# Patient Record
Sex: Female | Born: 1990 | Race: White | Hispanic: No | Marital: Married | Smoking: Never smoker
Health system: Southern US, Community
[De-identification: ages and names within clinical notes are randomized; demographics above are authoritative.]

## PROBLEM LIST (undated history)

## (undated) ENCOUNTER — Inpatient Hospital Stay (HOSPITAL_COMMUNITY): Payer: Self-pay

## (undated) DIAGNOSIS — M722 Plantar fascial fibromatosis: Secondary | ICD-10-CM

## (undated) DIAGNOSIS — E282 Polycystic ovarian syndrome: Secondary | ICD-10-CM

## (undated) DIAGNOSIS — F329 Major depressive disorder, single episode, unspecified: Secondary | ICD-10-CM

## (undated) DIAGNOSIS — D649 Anemia, unspecified: Secondary | ICD-10-CM

## (undated) DIAGNOSIS — R55 Syncope and collapse: Secondary | ICD-10-CM

## (undated) DIAGNOSIS — F32A Depression, unspecified: Secondary | ICD-10-CM

## (undated) HISTORY — DX: Major depressive disorder, single episode, unspecified: F32.9

## (undated) HISTORY — DX: Syncope and collapse: R55

## (undated) HISTORY — PX: WISDOM TOOTH EXTRACTION: SHX21

## (undated) HISTORY — DX: Plantar fascial fibromatosis: M72.2

## (undated) HISTORY — DX: Polycystic ovarian syndrome: E28.2

## (undated) HISTORY — DX: Depression, unspecified: F32.A

---

## 2009-08-31 ENCOUNTER — Emergency Department (HOSPITAL_COMMUNITY): Admission: EM | Admit: 2009-08-31 | Discharge: 2009-08-31 | Payer: Self-pay | Admitting: Emergency Medicine

## 2010-11-19 IMAGING — CR DG KNEE COMPLETE 4+V*R*
5 series · 5 of 5 positions shown · non-contrast
Comparison: None.

CLINICAL DATA: History of injury from fall with pain

RIGHT KNEE - COMPLETE 4+ VIEW

[t knee ap right]
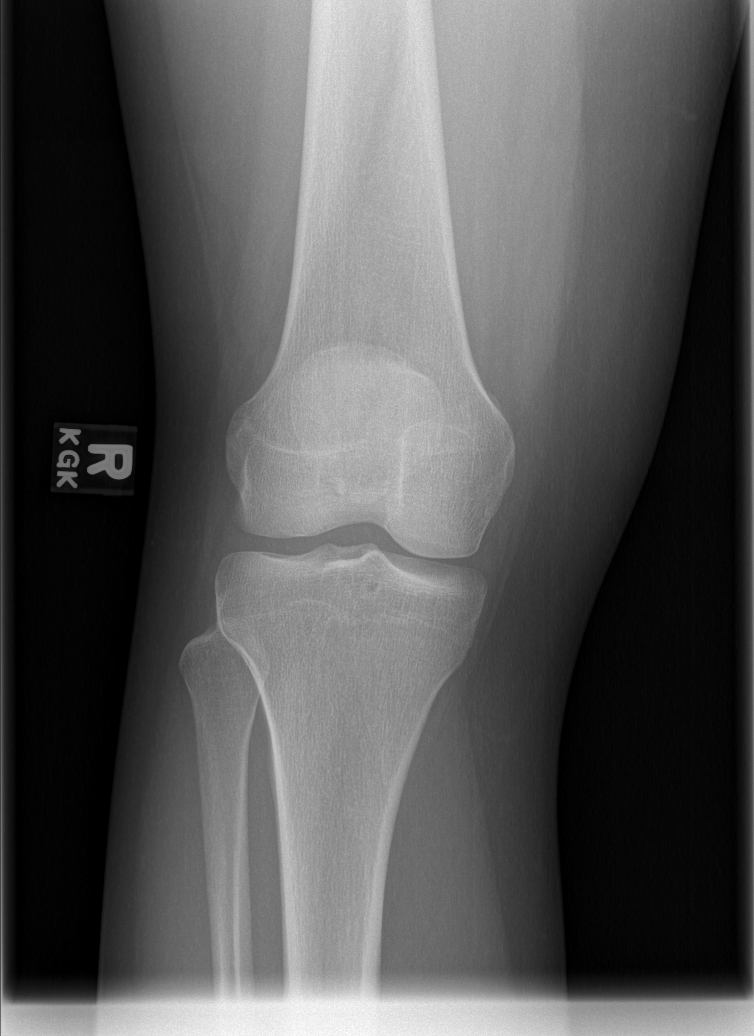

[t knee oblique right (1 of 2)]
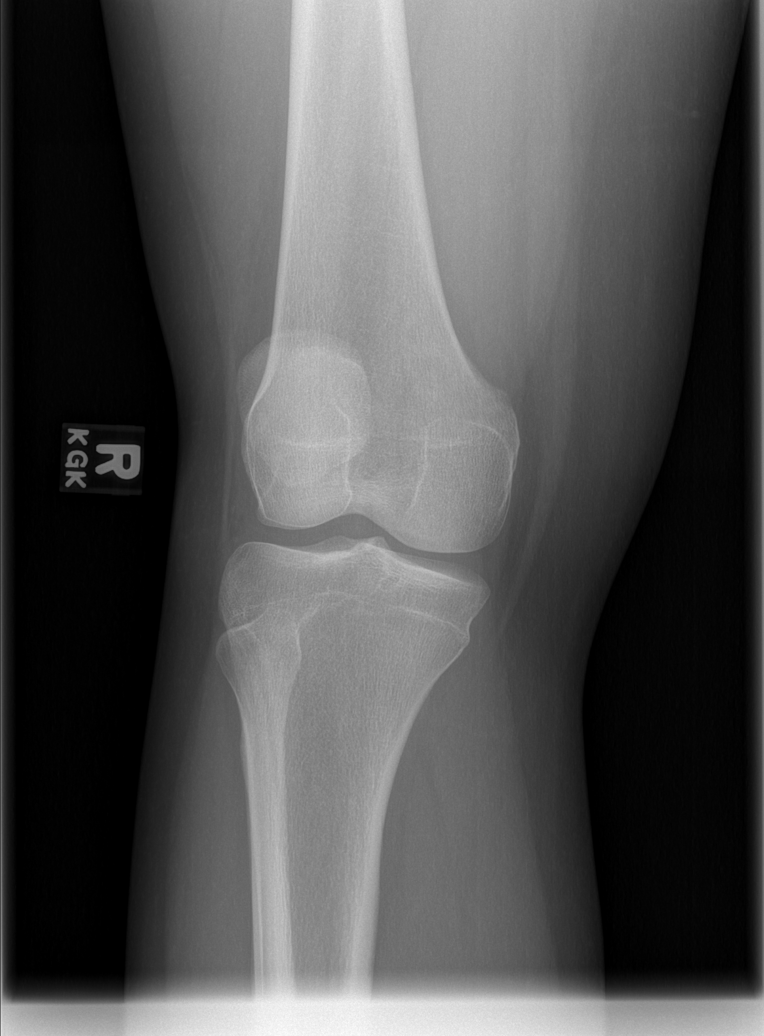

[t knee oblique right (2 of 2)]
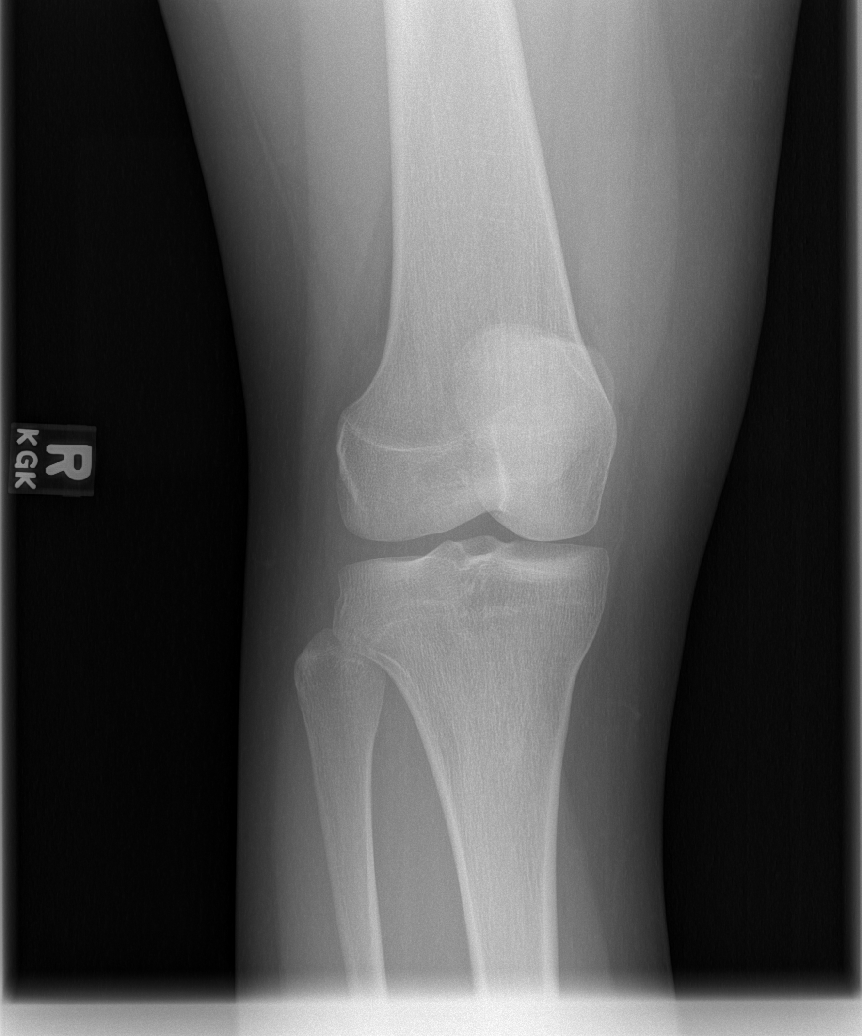

[t knee lat right (1 of 2)]
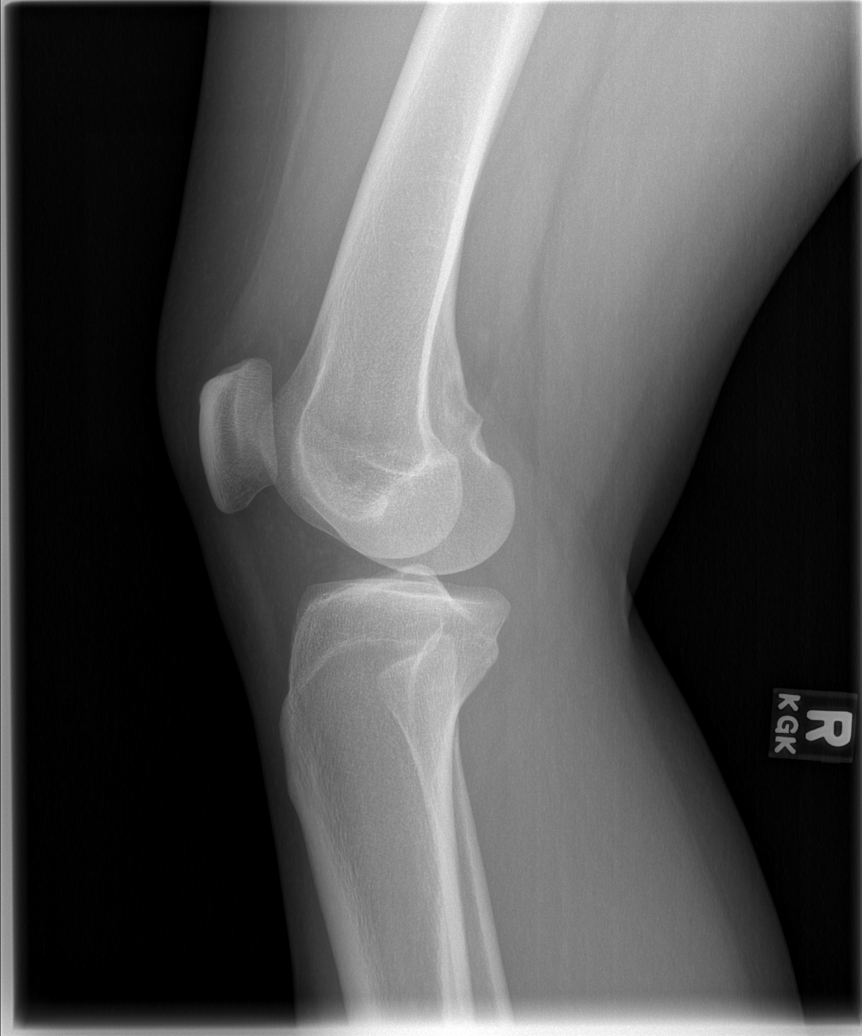

[t knee lat right (2 of 2)]
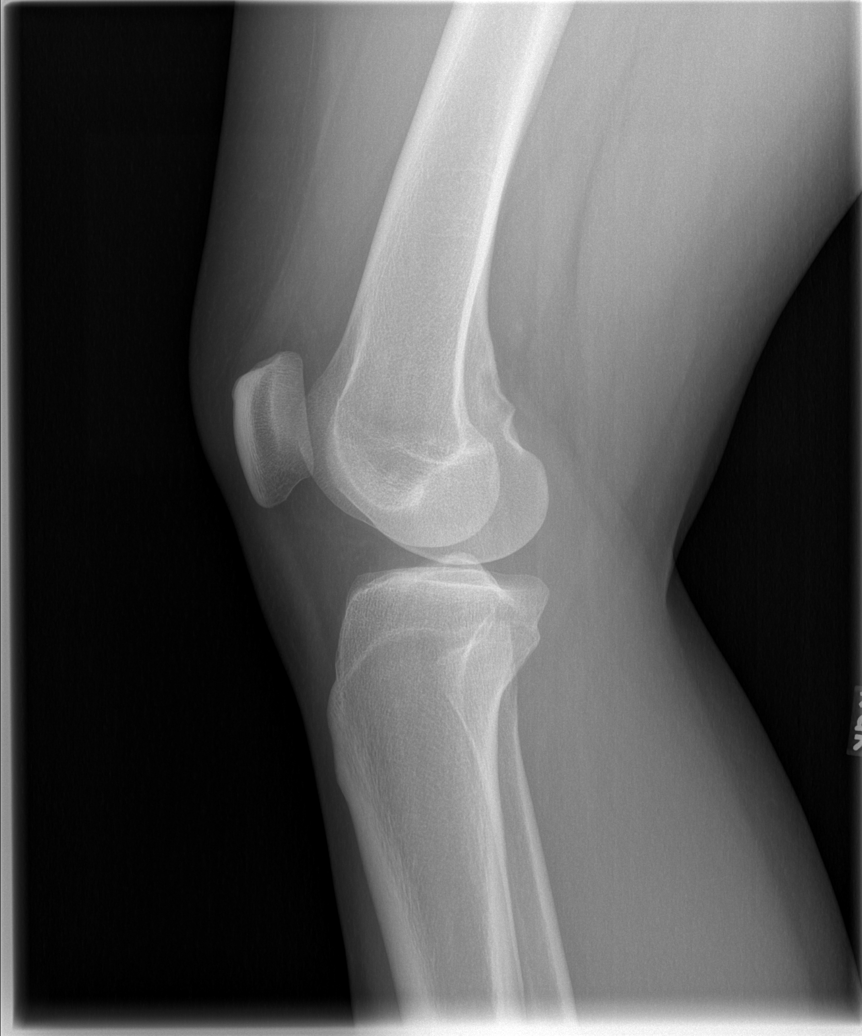

[5 of 5 positions shown; findings below may reference images not displayed]

FINDINGS: There is minimal density in the suprapatellar region on
lateral view which may reflect a small amount of joint effusion.
Alignment is normal.  Joint spaces are preserved.  No fracture or
dislocation is evident.  No soft tissue lesions are seen.
IMPRESSION: Minimal density in suprapatellar region may reflect small amount of
joint effusion but is not definite.  No fracture or dislocation is
evident.

## 2011-03-06 LAB — OB RESULTS CONSOLE GC/CHLAMYDIA: Chlamydia: NEGATIVE

## 2011-07-18 NOTE — L&D Delivery Note (Signed)
Delivery Note At 3:05 PM a viable and healthy female was delivered via Vaginal, Spontaneous Delivery (Presentation: ; Occiput Anterior).  APGAR: 8, 9; weight 9 lb 2.2 oz (4145 g).  Shoulder delivered without complications. Left ML episiotomy placed since head crowing against tight perineum was not allowing delivery. Placenta status: Intact, Spontaneous.  Cord: 3 vessels with the following complications: None.  Cord pH:not indicated Anesthesia: Epidural  Episiotomy: Left Mediolateral, no extensions. Lacerations:none other  Suture Repair: 3.0 vicryl rapide Est. Blood Loss (mL): 400  Mom to postpartum.  Baby to with mother.  Marrell Dicaprio R 02/20/2012, 5:04 PM

## 2011-08-03 LAB — OB RESULTS CONSOLE RPR: RPR: NONREACTIVE

## 2012-01-16 LAB — OB RESULTS CONSOLE GBS: GBS: NEGATIVE

## 2012-02-04 ENCOUNTER — Inpatient Hospital Stay (HOSPITAL_COMMUNITY)
Admission: AD | Admit: 2012-02-04 | Discharge: 2012-02-04 | Disposition: A | Discharge status: Home / Self Care | Payer: Managed Care, Other (non HMO) | Source: Ambulatory Visit | Attending: Obstetrics and Gynecology | Admitting: Obstetrics and Gynecology

## 2012-02-04 ENCOUNTER — Encounter (HOSPITAL_COMMUNITY): Payer: Self-pay | Admitting: *Deleted

## 2012-02-04 DIAGNOSIS — O99891 Other specified diseases and conditions complicating pregnancy: Secondary | ICD-10-CM | POA: Insufficient documentation

## 2012-02-04 DIAGNOSIS — R51 Headache: Secondary | ICD-10-CM | POA: Insufficient documentation

## 2012-02-04 HISTORY — DX: Anemia, unspecified: D64.9

## 2012-02-04 LAB — URINE MICROSCOPIC-ADD ON

## 2012-02-04 LAB — COMPREHENSIVE METABOLIC PANEL
Alkaline Phosphatase: 141 U/L — ABNORMAL HIGH (ref 39–117)
CO2: 21 mEq/L (ref 19–32)
Calcium: 9 mg/dL (ref 8.4–10.5)
GFR calc Af Amer: >90 mL/min (ref >90)
GFR calc non Af Amer: >90 mL/min (ref >90)
Glucose, Bld: 93 mg/dL (ref 70–99)
Potassium: 3.7 mEq/L (ref 3.5–5.1)
Sodium: 135 mEq/L (ref 135–145)
Total Bilirubin: 0.2 mg/dL — ABNORMAL LOW (ref 0.3–1.2)

## 2012-02-04 LAB — URINALYSIS, ROUTINE W REFLEX MICROSCOPIC
Ketones, ur: NEGATIVE mg/dL
Protein, ur: NEGATIVE mg/dL
Specific Gravity, Urine: 1.01 (ref 1.005–1.030)
Urobilinogen, UA: 0.2 mg/dL (ref 0.0–1.0)

## 2012-02-04 LAB — URIC ACID: Uric Acid, Serum: 3.8 mg/dL (ref 2.4–7.0)

## 2012-02-04 MED ORDER — HYDROCODONE-ACETAMINOPHEN 5-325 MG PO TABS
1.0000 | ORAL_TABLET | Freq: Once | ORAL | Status: AC
  Administered 2012-02-04: 1 via ORAL

## 2012-02-04 MED ORDER — HYDROCODONE-ACETAMINOPHEN 5-325 MG PO TABS
ORAL_TABLET | ORAL | Status: AC
  Administered 2012-02-04: 1 via ORAL
  Filled 2012-02-04: qty 1

## 2012-02-04 NOTE — MAU Provider Note (Signed)
History   21 yo G1P0 MWF now @ [redacted] week gestation presents for evaluation of persistent h/a. Pt denies visual changes, migraine or leg swelling. Pt notes h/a started yesterday. Took tylenol today w/o relief  Chief Complaint  Patient presents with  . Headache     OB History    Grav Para Term Preterm Abortions TAB SAB Ect Mult Living   2               Past Medical History  Diagnosis Date  . Anemia     Past Surgical History  Procedure Date  . Wisdom tooth extraction     No family history on file.  History  Substance Use Topics  . Smoking status: Never Smoker   . Smokeless tobacco: Not on file  . Alcohol Use: No    Allergies: No Known Allergies  Prescriptions prior to admission  Medication Sig Dispense Refill  . acetaminophen (TYLENOL) 500 MG tablet Take 500 mg by mouth every 6 (six) hours as needed. For pain      . ferrous sulfate 325 (65 FE) MG tablet Take 650 mg by mouth daily with breakfast.      . hydrocortisone cream 1 % Apply 1 application topically 3 (three) times daily. For poison ivy      . lansoprazole (PREVACID) 15 MG capsule Take 15 mg by mouth daily.      . metoCLOPramide (REGLAN) 10 MG tablet Take 10 mg by mouth 2 (two) times daily as needed. For nausea      . Prenatal Vit-Fe Fumarate-FA (PRENATAL MULTIVITAMIN) TABS Take 1 tablet by mouth daily.         Physical Exam   Blood pressure 112/72, pulse 86, temperature 98 F (36.7 C), temperature source Oral, resp. rate 16, height 5\' 4"  (1.626 m), weight 97.07 kg (214 lb).  General appearance: alert, cooperative and no distress Lungs: clear to auscultation bilaterally Heart: regular rate and rhythm, S1, S2 normal, no murmur, click, rub or gallop Abdomen: gravid nontender soft Extremities: no edema, redness or tenderness in the calves or thighs  Tracing reactive. Baseline 140's (-) ctx ED Course  H/A. BP nl w/o any suggestion of preeclampsia IUP @ 38 wk P) PiH labs r/o atypical preeclampsia. vicodin x  1 . If nl PIH labs d/c home. Keep sched OB appt MDM   Tula Schryver A, MD 2:51 PM 02/04/2012

## 2012-02-04 NOTE — MAU Note (Signed)
Patient presents with c/o headache since last night patient is [redacted] weeks gestation G1

## 2012-02-04 NOTE — Progress Notes (Signed)
PIH labs WNL except platelets 144. OK to d/c pt home

## 2012-02-13 ENCOUNTER — Encounter (HOSPITAL_COMMUNITY): Payer: Self-pay | Admitting: *Deleted

## 2012-02-13 ENCOUNTER — Telehealth (HOSPITAL_COMMUNITY): Payer: Self-pay | Admitting: *Deleted

## 2012-02-13 ENCOUNTER — Other Ambulatory Visit: Payer: Self-pay | Admitting: Obstetrics & Gynecology

## 2012-02-13 NOTE — Telephone Encounter (Signed)
Preadmission screen  

## 2012-02-19 ENCOUNTER — Encounter (HOSPITAL_COMMUNITY): Payer: Self-pay

## 2012-02-19 ENCOUNTER — Inpatient Hospital Stay (HOSPITAL_COMMUNITY)
Admission: RE | Admit: 2012-02-19 | Discharge: 2012-02-22 | DRG: 775 | Disposition: A | Discharge status: Home / Self Care | Payer: Managed Care, Other (non HMO) | Source: Ambulatory Visit | Attending: Obstetrics | Admitting: Obstetrics

## 2012-02-19 DIAGNOSIS — O480 Post-term pregnancy: Principal | ICD-10-CM | POA: Diagnosis present

## 2012-02-19 LAB — CBC
Hemoglobin: 12.1 g/dL (ref 12.0–15.0)
MCH: 28.3 pg (ref 26.0–34.0)
MCHC: 33.5 g/dL (ref 30.0–36.0)
RDW: 20.3 % — ABNORMAL HIGH (ref 11.5–15.5)

## 2012-02-19 MED ORDER — ONDANSETRON HCL 4 MG/2ML IJ SOLN
4.0000 mg | Freq: Four times a day (QID) | INTRAMUSCULAR | Status: DC | PRN
  Administered 2012-02-20: 4 mg via INTRAVENOUS
  Filled 2012-02-19: qty 2

## 2012-02-19 MED ORDER — OXYTOCIN 40 UNITS IN LACTATED RINGERS INFUSION - SIMPLE MED: 62.5000 mL/h | Freq: Once | INTRAVENOUS | Status: DC

## 2012-02-19 MED ORDER — FLEET ENEMA 7-19 GM/118ML RE ENEM: 1.0000 | ENEMA | RECTAL | Status: DC | PRN

## 2012-02-19 MED ORDER — CITRIC ACID-SODIUM CITRATE 334-500 MG/5ML PO SOLN: 30.0000 mL | ORAL | Status: DC | PRN

## 2012-02-19 MED ORDER — BUTORPHANOL TARTRATE 1 MG/ML IJ SOLN
1.0000 mg | INTRAMUSCULAR | Status: DC | PRN
  Administered 2012-02-20: 1 mg via INTRAVENOUS
  Filled 2012-02-19: qty 1

## 2012-02-19 MED ORDER — IBUPROFEN 600 MG PO TABS: 600.0000 mg | ORAL_TABLET | Freq: Four times a day (QID) | ORAL | Status: DC | PRN

## 2012-02-19 MED ORDER — ZOLPIDEM TARTRATE 5 MG PO TABS
5.0000 mg | ORAL_TABLET | Freq: Once | ORAL | Status: AC
  Administered 2012-02-19: 5 mg via ORAL
  Filled 2012-02-19: qty 1

## 2012-02-19 MED ORDER — LACTATED RINGERS IV SOLN: 500.0000 mL | INTRAVENOUS | Status: DC | PRN

## 2012-02-19 MED ORDER — OXYTOCIN 40 UNITS IN LACTATED RINGERS INFUSION - SIMPLE MED
1.0000 m[IU]/min | INTRAVENOUS | Status: DC
  Administered 2012-02-20: 2 m[IU]/min via INTRAVENOUS
  Administered 2012-02-20: 666 m[IU]/min via INTRAVENOUS
  Filled 2012-02-19: qty 1000

## 2012-02-19 MED ORDER — MISOPROSTOL 25 MCG QUARTER TABLET
25.0000 ug | ORAL_TABLET | ORAL | Status: DC
  Filled 2012-02-19 (×5): qty 1

## 2012-02-19 MED ORDER — LIDOCAINE HCL (PF) 1 % IJ SOLN
30.0000 mL | INTRAMUSCULAR | Status: DC | PRN
  Filled 2012-02-19: qty 30

## 2012-02-19 MED ORDER — LACTATED RINGERS IV SOLN
INTRAVENOUS | Status: DC
  Administered 2012-02-19 – 2012-02-20 (×2): via INTRAVENOUS

## 2012-02-19 MED ORDER — OXYCODONE-ACETAMINOPHEN 5-325 MG PO TABS: 1.0000 | ORAL_TABLET | ORAL | Status: DC | PRN

## 2012-02-19 MED ORDER — OXYTOCIN BOLUS FROM INFUSION
250.0000 mL | Freq: Once | INTRAVENOUS | Status: DC
  Filled 2012-02-19: qty 500

## 2012-02-19 MED ORDER — ACETAMINOPHEN 325 MG PO TABS: 650.0000 mg | ORAL_TABLET | ORAL | Status: DC | PRN

## 2012-02-19 MED ORDER — TERBUTALINE SULFATE 1 MG/ML IJ SOLN: 0.2500 mg | Freq: Once | INTRAMUSCULAR | Status: AC | PRN

## 2012-02-19 NOTE — H&P (Addendum)
Brenda Young is a 21 y.o. female presenting for IOL for dates, cervix favorable. Healthy woman but frequent HAs, nausea/vomiting/GERD in this pregnancy. Depression ok, no meds. FamHx thyroid disorder, pt's labs normal.  [facility]- 8 wks/ good PNcare. Labs nl, Rh neg, s/p rhogam.   History OB History    Grav Para Term Preterm Abortions TAB SAB Ect Mult Living   1              Past Medical History  Diagnosis Date  . Anemia   . Depression   . Plantar fasciitis    Past Surgical History  Procedure Date  . Wisdom tooth extraction    Family History: family history includes Cancer in her maternal grandfather; Diabetes in her brother; Heart attack in her paternal grandfather; and Stroke in her paternal grandfather. Social History:  reports that she has never smoked. She does not have any smokeless tobacco history on file. She reports that she does not drink alcohol or use illicit drugs.   Prenatal Transfer Tool  Maternal Diabetes: No Genetic Screening: Normal Maternal Ultrasounds/Referrals: Normal Fetal Ultrasounds or other Referrals:  None Maternal Substance Abuse:  No Significant Maternal Medications:  None Significant Maternal Lab Results:  None Other Comments:  None  Review of Systems  Eyes: Negative for blurred vision.  Respiratory: Negative for shortness of breath.   Cardiovascular: Negative for chest pain.  Neurological: Negative for dizziness and headaches.    Dilation: 2.5 Effacement (%): 50 Station: -1 Exam by:: Bennye Alm, RN Blood pressure 124/70, pulse 85, temperature 97.8 F (36.6 C), temperature source Oral, resp. rate 20, height 5\' 4"  (1.626 m), weight 218 lb (98.884 kg). Exam Physical Exam   Prenatal labs: ABO, Rh: O/Negative/-- (01/17 0000), s/p Rhogam Antibody: Negative (01/17 0000) Rubella: Immune (01/17 0000) RPR: Nonreactive (01/17 0000)  HBsAg: Negative (01/17 0000)  HIV: Non-reactive (01/17 0000)  GBS: Negative (07/02 0000)    Anatomy sono - nl, girl  1 hr glucola nl Genetic screening- QUAD scr negative.   Assessment/Plan: 21 yo, G1 at 40.5/7 wks, IOL.  GBS neg. Pitocin if UCs space out, Epidural ok after 4 cm. FHT category I.  MODY,VAISHALI R 02/19/2012, 10:11 PM  Pt notes feeling "bothersome" but "not painful" contractions every few minutes since her membranes were swept in office today. Good fetal movement, no VB.   Remainder of H&P as per note above.  PE: Filed Vitals:   02/19/12 2035  BP: 124/70  Pulse: 85  Temp: 97.8 F (36.6 C)  TempSrc: Oral  Resp: 20  Height: 5\' 4"  (1.626 m)  Weight: 98.884 kg (218 lb)   Gen: well appearing CV: RRR Pulm: CTAB Abd: gravid, soft, NT GU: def LE: 1+ edema, NT  Toco: irritibility q 3 min FH: 130's, + accels, no decels, 10 beat var  A/P: IOL for post-dates - ctx on own. Cont expectant management o/n. Will re-eval in a few hrs and if ctx space out will plan single dose of cytotec around 2 am prior to pitocin start at 7 am. - FWB reactive, as pt on no induction meds at this time, OK to take a break from monitor. - GBS neg  Patrina Andreas A. 02/19/2012 11:29 PM

## 2012-02-19 NOTE — Progress Notes (Signed)
Dr. Ernestina Penna notified of arrival of induction, SVE, FHR, and current orders.  New orders received.  Patient advised of plan of care and all questions answered.

## 2012-02-20 ENCOUNTER — Encounter (HOSPITAL_COMMUNITY): Payer: Self-pay

## 2012-02-20 ENCOUNTER — Encounter (HOSPITAL_COMMUNITY): Payer: Self-pay | Admitting: Anesthesiology

## 2012-02-20 ENCOUNTER — Inpatient Hospital Stay (HOSPITAL_COMMUNITY): Payer: Managed Care, Other (non HMO) | Admitting: Anesthesiology

## 2012-02-20 LAB — RPR: RPR Ser Ql: NONREACTIVE

## 2012-02-20 LAB — TYPE AND SCREEN

## 2012-02-20 MED ORDER — ONDANSETRON HCL 4 MG/2ML IJ SOLN: 4.0000 mg | INTRAMUSCULAR | Status: DC | PRN

## 2012-02-20 MED ORDER — ZOLPIDEM TARTRATE 5 MG PO TABS: 5.0000 mg | ORAL_TABLET | Freq: Every evening | ORAL | Status: DC | PRN

## 2012-02-20 MED ORDER — DIBUCAINE 1 % RE OINT: 1.0000 "application " | TOPICAL_OINTMENT | RECTAL | Status: DC | PRN

## 2012-02-20 MED ORDER — FENTANYL 2.5 MCG/ML BUPIVACAINE 1/10 % EPIDURAL INFUSION (WH - ANES)
14.0000 mL/h | INTRAMUSCULAR | Status: DC
  Administered 2012-02-20 (×3): 14 mL/h via EPIDURAL
  Filled 2012-02-20 (×3): qty 60

## 2012-02-20 MED ORDER — DIPHENHYDRAMINE HCL 25 MG PO CAPS: 25.0000 mg | ORAL_CAPSULE | Freq: Four times a day (QID) | ORAL | Status: DC | PRN

## 2012-02-20 MED ORDER — ONDANSETRON HCL 4 MG PO TABS: 4.0000 mg | ORAL_TABLET | ORAL | Status: DC | PRN

## 2012-02-20 MED ORDER — SIMETHICONE 80 MG PO CHEW: 80.0000 mg | CHEWABLE_TABLET | ORAL | Status: DC | PRN

## 2012-02-20 MED ORDER — LANOLIN HYDROUS EX OINT: TOPICAL_OINTMENT | CUTANEOUS | Status: DC | PRN

## 2012-02-20 MED ORDER — EPHEDRINE 5 MG/ML INJ: 10.0000 mg | INTRAVENOUS | Status: DC | PRN

## 2012-02-20 MED ORDER — OXYCODONE-ACETAMINOPHEN 5-325 MG PO TABS
1.0000 | ORAL_TABLET | ORAL | Status: DC | PRN
  Administered 2012-02-20: 1 via ORAL
  Filled 2012-02-20: qty 1

## 2012-02-20 MED ORDER — DIPHENHYDRAMINE HCL 50 MG/ML IJ SOLN: 12.5000 mg | INTRAMUSCULAR | Status: DC | PRN

## 2012-02-20 MED ORDER — TETANUS-DIPHTH-ACELL PERTUSSIS 5-2.5-18.5 LF-MCG/0.5 IM SUSP
0.5000 mL | Freq: Once | INTRAMUSCULAR | Status: AC
  Administered 2012-02-21: 0.5 mL via INTRAMUSCULAR
  Filled 2012-02-20: qty 0.5

## 2012-02-20 MED ORDER — LIDOCAINE HCL (PF) 1 % IJ SOLN
INTRAMUSCULAR | Status: DC | PRN
  Administered 2012-02-20 (×3): 4 mL

## 2012-02-20 MED ORDER — LACTATED RINGERS IV SOLN: 500.0000 mL | Freq: Once | INTRAVENOUS | Status: DC

## 2012-02-20 MED ORDER — SENNOSIDES-DOCUSATE SODIUM 8.6-50 MG PO TABS
2.0000 | ORAL_TABLET | Freq: Every day | ORAL | Status: DC
  Administered 2012-02-20: 2 via ORAL

## 2012-02-20 MED ORDER — EPHEDRINE 5 MG/ML INJ
10.0000 mg | INTRAVENOUS | Status: DC | PRN
  Filled 2012-02-20: qty 4

## 2012-02-20 MED ORDER — WITCH HAZEL-GLYCERIN EX PADS: 1.0000 "application " | MEDICATED_PAD | CUTANEOUS | Status: DC | PRN

## 2012-02-20 MED ORDER — PRENATAL MULTIVITAMIN CH
1.0000 | ORAL_TABLET | Freq: Every day | ORAL | Status: DC
  Administered 2012-02-20: 1 via ORAL
  Filled 2012-02-20 (×3): qty 1

## 2012-02-20 MED ORDER — PHENYLEPHRINE 40 MCG/ML (10ML) SYRINGE FOR IV PUSH (FOR BLOOD PRESSURE SUPPORT): 80.0000 ug | PREFILLED_SYRINGE | INTRAVENOUS | Status: DC | PRN

## 2012-02-20 MED ORDER — PHENYLEPHRINE 40 MCG/ML (10ML) SYRINGE FOR IV PUSH (FOR BLOOD PRESSURE SUPPORT)
80.0000 ug | PREFILLED_SYRINGE | INTRAVENOUS | Status: DC | PRN
  Filled 2012-02-20: qty 5

## 2012-02-20 MED ORDER — BENZOCAINE-MENTHOL 20-0.5 % EX AERO
1.0000 "application " | INHALATION_SPRAY | CUTANEOUS | Status: DC | PRN
  Administered 2012-02-21: 1 via TOPICAL
  Filled 2012-02-20: qty 56

## 2012-02-20 MED ORDER — IBUPROFEN 600 MG PO TABS
600.0000 mg | ORAL_TABLET | Freq: Four times a day (QID) | ORAL | Status: DC
  Administered 2012-02-20 – 2012-02-22 (×7): 600 mg via ORAL
  Filled 2012-02-20 (×9): qty 1

## 2012-02-20 NOTE — Progress Notes (Signed)
Dr. Ernestina Penna notified of increasing pain level 2 hours after IV pain med.  Order received for epidural.  Patient notified of plan of care.

## 2012-02-20 NOTE — Progress Notes (Signed)
S: Comfortable with epidural  Objective Filed Vitals:   02/20/12 0702 02/20/12 0730 02/20/12 0802 02/20/12 0832  BP: 104/64 124/92 101/50 101/55  Pulse: 100 130 98 104  Temp:  97.9 F (36.6 C)    TempSrc:  Axillary    Resp: 18 16 16 16   Height:      Weight:       General well-appearing GU: The cervix 4 cm, 50% effaced, vertex -2, AROM for scant fluid, IUPC placed without difficulty Toco: Hit at 4 milliunits per minutes, irritability every 2 minutes FH: 130s, 10 beat variability, no decelerations, positive accelerations  Assessment and plan: This is a 21 year old G1 at 40 weeks and 6 days for post dates induction of labor Fetal well being: Reactive Induction of labor: Adequate progress to this point, expect more rapid change now status post arom, will titrate Pitocin to [place] units Pain: Epidural  Treyven Lafauci A. 02/20/2012 9:05 AM

## 2012-02-20 NOTE — Anesthesia Preprocedure Evaluation (Signed)

## 2012-02-20 NOTE — Progress Notes (Signed)
Brenda Young is a 21 y.o. G1P0000 at [redacted]w[redacted]d, was supposed to be IOL admission but was in spontaneous labor and progressed some, with pitocin AOL now. GBS neg. C/o vomiting, had gastritic in pregnancy. S/p epidural, no labor pain.   Objective: BP 93/69  Pulse 124  Temp 98.1 F (36.7 C) (Axillary)  Resp 16  Ht 5\' 4"  (1.626 m)  Wt 218 lb (98.884 kg)  BMI 37.42 kg/m2   Total I/O In: -  Out: 850 [Urine:850]  FHT:  FHR: 120s bpm, variability: moderate,  accelerations:  Present,  decelerations:  Absent UC:   regular, every 2 minutes SVE:   Dilation: 10 Effacement (%): 100 Station: +1;+2 Exam by:: Dr. Juliene Pina  Assessment / Plan: Augmentation of labor, progressing well Fetal Wellbeing:  Category I Pain Control:  Epidural Anticipated MOD:  NSVD EFW 9-9.1/2 lbs, risk of shoulder dystocia and assistance in room, dystocia maneuvers reviewed.    Brenda Young R 02/20/2012, 2:05 PM

## 2012-02-20 NOTE — Anesthesia Procedure Notes (Signed)
Epidural Patient location during procedure: OB Start time: 02/20/2012 5:36 AM Reason for block: procedure for pain  Staffing Performed by: anesthesiologist   Preanesthetic Checklist Completed: patient identified, site marked, surgical consent, pre-op evaluation, timeout performed, IV checked, risks and benefits discussed and monitors and equipment checked  Epidural Patient position: sitting Prep: site prepped and draped and DuraPrep Patient monitoring: continuous pulse ox and blood pressure Approach: midline Injection technique: LOR air  Needle:  Needle type: Tuohy  Needle gauge: 17 G Needle length: 9 cm Needle insertion depth: 6 cm Catheter type: closed end flexible Catheter size: 19 Gauge Catheter at skin depth: 11 cm Test dose: negative  Assessment Events: blood not aspirated, injection not painful, no injection resistance, negative IV test and no paresthesia  Additional Notes Discussed risk of headache, infection, bleeding, nerve injury and failed or incomplete block.  Patient voices understanding and wishes to proceed.

## 2012-02-21 LAB — CBC
Hemoglobin: 10.3 g/dL — ABNORMAL LOW (ref 12.0–15.0)
MCH: 27.5 pg (ref 26.0–34.0)
MCHC: 31.8 g/dL (ref 30.0–36.0)

## 2012-02-21 NOTE — Progress Notes (Signed)
Patient ID: Brenda Young, female   DOB: 08/04/1990, 21 y.o.   MRN: [id number] PPD # 1  Subjective: Pt reports feeling well/ Pain controlled with ibuprofen and occ percocet Tolerating po/ Voiding without problems/ No n/v Bleeding is moderate Newborn info:  Information for the patient's newborn:  Mayorga, Girl Jammy [[id number]]  female Feeding: breast   Objective:  VS: Blood pressure 106/72, pulse 88, temperature 98.2 F (36.8 C), temperature source Oral, resp. rate 18   Basename 02/21/12 0535 02/19/12 2020  WBC 11.6* 10.7*  HGB 10.3* 12.1  HCT 32.4* 36.1  PLT 136* 157    Blood type: --/--/O NEG (08/05 2020) (Infant A Neg) Rubella: Immune (01/17 0000)    Physical Exam:  General: A & O x 3  alert, cooperative and no distress CV: Regular rate and rhythm Resp: clear Abdomen: soft, nontender, normal bowel sounds Uterine Fundus: firm, below umbilicus, nontender Perineum: healing with good reapproximation and mild edema Lochia: moderate Ext: edema +1 and Homans sign is negative, no sign of DVT   A/P: PPD # 1/ G1P1001/ S/P:spontaneous vaginal delivery with episiotomy Doing well Continue routine post partum orders Anticipate D/C home in AM    Demetrius Revel, MSN, [facility] 02/21/2012, 9:57 AM

## 2012-02-21 NOTE — Anesthesia Postprocedure Evaluation (Signed)
  Anesthesia Post-op Note  Patient: Brenda Young  Procedure(s) Performed: * No procedures listed *  Patient Location: PACU and Mother/Baby  Anesthesia Type: Epidural  Level of Consciousness: awake, alert  and oriented  Airway and Oxygen Therapy: Patient Spontanous Breathing  Post-op Pain: none  Post-op Assessment: Post-op Vital signs reviewed  Post-op Vital Signs: Reviewed and stable  Complications: No apparent anesthesia complications

## 2012-02-22 MED ORDER — BREAST PUMP MISC: 1.0000 [IU] | Status: DC | PRN

## 2012-02-22 MED ORDER — IBUPROFEN 600 MG PO TABS: 600.0000 mg | ORAL_TABLET | Freq: Four times a day (QID) | ORAL | Status: AC

## 2012-02-22 NOTE — Progress Notes (Signed)
Post Partum Day #2            Information for the patient's newborn:  Guevara, Girl Reianna [[id number]]  female   Feeding: breast  Subjective: No HA, SOB, CP, F/C, breast symptoms. Pain minimal. Normal vaginal bleeding, no clots.      Objective:  Temp:  [97.9 F (36.6 C)-98.5 F (36.9 C)] 98.5 F (36.9 C) (08/08 0540) Pulse Rate:  [74-97] 74  (08/08 0540) Resp:  [16-18] 18  (08/08 0540) BP: (92-118)/(61-75) 92/61 mmHg (08/08 0540) SpO2:  [94 %-95 %] 95 % (08/08 0540)  No intake or output data in the 24 hours ending 02/22/12 1125     Basename 02/21/12 0535 02/19/12 2020  WBC 11.6* 10.7*  HGB 10.3* 12.1  HCT 32.4* 36.1  PLT 136* 157    Blood type: --/--/O NEG (08/05 2020) Rubella: Immune (01/17 0000)    Physical Exam:  General: alert, cooperative and no distress Uterine Fundus: firm Lochia: appropriate Perineum: repair intact, edema none DVT Evaluation: Negative Homan's sign. No significant calf/ankle edema.    Assessment/Plan: PPD # 2 / 21 y.o., G1P1001 S/P:spontaneous vaginal   Active Problems:  SVD (spontaneous vaginal delivery) 02/20/12    normal postpartum exam  Continue current postpartum care  D/C home   LOS: 3 days   PAUL,DANIELA, CNM, MSN 02/22/2012, 11:25 AM

## 2012-02-22 NOTE — Discharge Summary (Signed)
Obstetric Discharge Summary Reason for Admission: induction of labor and post dates Prenatal Procedures: ultrasound Intrapartum Procedures: spontaneous vaginal delivery and episiotomy R mediolateral Postpartum Procedures: TDaP vaccine Complications-Operative and Postpartum: none Hemoglobin  Date Value Range Status  02/21/2012 10.3* 12.0 - 15.0 g/dL Final     HCT  Date Value Range Status  02/21/2012 32.4* 36.0 - 46.0 % Final    Physical Exam:  General: alert, cooperative and no distress Lochia: appropriate Uterine Fundus: firm Incision: healing well DVT Evaluation: Negative Homan's sign. No cords or calf tenderness.  Discharge Diagnoses: Term Pregnancy-delivered  Discharge Information: Date: 02/22/2012 Activity: pelvic rest Diet: routine Medications: PNV and Ibuprofen Condition: stable Instructions: refer to practice specific booklet Discharge to: home Follow-up Information    Follow up with MODY,VAISHALI R, MD. Schedule an appointment as soon as possible for a visit in 6 weeks.   Contact information:   [address] [place] [address] [phone]          Newborn Data: Live born female  Birth Weight: 9 lb 2.2 oz (4145 g) APGAR: 8, 9  Home with mother.  Kamden Stanislaw 02/22/2012, 11:28 AM

## 2012-02-25 NOTE — Discharge Summary (Signed)
Reviewed and agree with note V.Vernard Gram, MD 

## 2014-05-18 ENCOUNTER — Encounter (HOSPITAL_COMMUNITY): Payer: Self-pay

## 2015-07-18 NOTE — L&D Delivery Note (Signed)
Delivery Note At 10:01 PM a viable female was delivered via Vaginal, Spontaneous Delivery (Presentation: ROA ).  APGAR: 8, 9; weight pending. Placenta status: delivered by Tomasa BlaseSchultz.  Cord: 3 vessel with the following complications: none.  Cord pH: n/a  Anesthesia: epidural  Episiotomy: None Lacerations: None Est. Blood Loss (mL): 50  Mom to postpartum.  Baby to Couplet care / Skin to Skin.  Donette LarryMelanie Joan Herschberger, CNM 05/21/2016, 10:42 PM

## 2015-09-23 ENCOUNTER — Ambulatory Visit (INDEPENDENT_AMBULATORY_CARE_PROVIDER_SITE_OTHER): Payer: Self-pay | Admitting: General Practice

## 2015-09-23 DIAGNOSIS — Z3201 Encounter for pregnancy test, result positive: Secondary | ICD-10-CM

## 2015-09-23 LAB — POCT PREGNANCY, URINE: Preg Test, Ur: POSITIVE — AB

## 2015-09-23 NOTE — Progress Notes (Signed)
Patient here for pregnancy test today. UPT positive. Patient reports first positive home test 4 days ago. LMP 08/19/15 EDD 05/25/16. Patient informed & wishes to start care here. Encouraged patient to take PNV and gave new OB packet. Patient sent up front to schedule new OB visit around 4/19.

## 2015-09-27 ENCOUNTER — Telehealth: Payer: Self-pay | Admitting: *Deleted

## 2015-09-27 NOTE — Telephone Encounter (Signed)
Brenda Young called this afternoon and left a message she is calling because she is pregnant and she knows weird things happen in pregnancy; but she looked in the mirror and saw some busted blood vessels under lip. Wants to know if this is bad . States is her 3rd pregnancy but never had this before.    Per chart review 25 years old, no history medical issues.  [facility] C[facility] and she denies any bleeding ; denies any other broken vessels elsewhere. We discussed I do not think this is related to pregnancy, just coincidental. She states only medical issues are anemia, PCOS.  I advised her to keep next appointment as scheduled and if she has anymore of this areas to pop up, or bleeding to call to be seen; or if she has any other issues to call clinic. She voices understanding.

## 2015-10-19 ENCOUNTER — Telehealth: Payer: Self-pay | Admitting: *Deleted

## 2015-10-19 NOTE — Telephone Encounter (Signed)
Called patient back and she stated that the cramping has stopped. Advised her that some cramps are normal and if she ever has severe pain to come to [facility] for evaluation. Patient is agreeable to this and has no further questions.

## 2015-10-19 NOTE — Telephone Encounter (Signed)
Patient called and stated that she is experiencing some cramping. She is about [redacted] weeks pregnant. She would like a call back to find out if this is normal.

## 2015-11-03 ENCOUNTER — Ambulatory Visit (INDEPENDENT_AMBULATORY_CARE_PROVIDER_SITE_OTHER): Payer: Self-pay | Admitting: Family

## 2015-11-03 ENCOUNTER — Encounter: Payer: Self-pay | Admitting: Family

## 2015-11-03 VITALS — BP 108/57 | HR 107 | Wt 207.0 lb

## 2015-11-03 DIAGNOSIS — O0990 Supervision of high risk pregnancy, unspecified, unspecified trimester: Secondary | ICD-10-CM

## 2015-11-03 DIAGNOSIS — Z3490 Encounter for supervision of normal pregnancy, unspecified, unspecified trimester: Secondary | ICD-10-CM | POA: Insufficient documentation

## 2015-11-03 DIAGNOSIS — Z113 Encounter for screening for infections with a predominantly sexual mode of transmission: Secondary | ICD-10-CM

## 2015-11-03 DIAGNOSIS — Z3491 Encounter for supervision of normal pregnancy, unspecified, first trimester: Secondary | ICD-10-CM

## 2015-11-03 DIAGNOSIS — Z23 Encounter for immunization: Secondary | ICD-10-CM

## 2015-11-03 LAB — POCT URINALYSIS DIP (DEVICE)
Bilirubin Urine: NEGATIVE
GLUCOSE, UA: NEGATIVE mg/dL
HGB URINE DIPSTICK: NEGATIVE
KETONES UR: NEGATIVE mg/dL
Nitrite: NEGATIVE
PH: 5.5 (ref 5.0–8.0)
PROTEIN: NEGATIVE mg/dL
SPECIFIC GRAVITY, URINE: 1.025 (ref 1.005–1.030)
UROBILINOGEN UA: 0.2 mg/dL (ref 0.0–1.0)

## 2015-11-03 LAB — OB RESULTS CONSOLE GC/CHLAMYDIA: GC PROBE AMP, GENITAL: NEGATIVE

## 2015-11-03 NOTE — Patient Instructions (Signed)
First Trimester of Pregnancy The first trimester of pregnancy is from week 1 until the end of week 12 (months 1 through 3). A week after a sperm fertilizes an egg, the egg will implant on the wall of the uterus. This embryo will begin to develop into a baby. Genes from you and your partner are forming the baby. The female genes determine whether the baby is a boy or a girl. At 6-8 weeks, the eyes and face are formed, and the heartbeat can be seen on ultrasound. At the end of 12 weeks, all the baby's organs are formed.  Now that you are pregnant, you will want to do everything you can to have a healthy baby. Two of the most important things are to get good prenatal care and to follow your health care provider's instructions. Prenatal care is all the medical care you receive before the baby's birth. This care will help prevent, find, and treat any problems during the pregnancy and childbirth. BODY CHANGES Your body goes through many changes during pregnancy. The changes vary from woman to woman.   You may gain or lose a couple of pounds at first.  You may feel sick to your stomach (nauseous) and throw up (vomit). If the vomiting is uncontrollable, call your health care provider.  You may tire easily.  You may develop headaches that can be relieved by medicines approved by your health care provider.  You may urinate more often. Painful urination may mean you have a bladder infection.  You may develop heartburn as a result of your pregnancy.  You may develop constipation because certain hormones are causing the muscles that push waste through your intestines to slow down.  You may develop hemorrhoids or swollen, bulging veins (varicose veins).  Your breasts may begin to grow larger and become tender. Your nipples may stick out more, and the tissue that surrounds them (areola) may become darker.  Your gums may bleed and may be sensitive to brushing and flossing.  Dark spots or blotches (chloasma,  mask of pregnancy) may develop on your face. This will likely fade after the baby is born.  Your menstrual periods will stop.  You may have a loss of appetite.  You may develop cravings for certain kinds of food.  You may have changes in your emotions from day to day, such as being excited to be pregnant or being concerned that something may go wrong with the pregnancy and baby.  You may have more vivid and strange dreams.  You may have changes in your hair. These can include thickening of your hair, rapid growth, and changes in texture. Some women also have hair loss during or after pregnancy, or hair that feels dry or thin. Your hair will most likely return to normal after your baby is born. WHAT TO EXPECT AT YOUR PRENATAL VISITS During a routine prenatal visit:  You will be weighed to make sure you and the baby are growing normally.  Your blood pressure will be taken.  Your abdomen will be measured to track your baby's growth.  The fetal heartbeat will be listened to starting around week 10 or 12 of your pregnancy.  Test results from any previous visits will be discussed. Your health care provider may ask you:  How you are feeling.  If you are feeling the baby move.  If you have had any abnormal symptoms, such as leaking fluid, bleeding, severe headaches, or abdominal cramping.  If you are using any tobacco products,   including cigarettes, chewing tobacco, and electronic cigarettes.  If you have any questions. Other tests that may be performed during your first trimester include:  Blood tests to find your blood type and to check for the presence of any previous infections. They will also be used to check for low iron levels (anemia) and Rh antibodies. Later in the pregnancy, blood tests for diabetes will be done along with other tests if problems develop.  Urine tests to check for infections, diabetes, or protein in the urine.  An ultrasound to confirm the proper growth  and development of the baby.  An amniocentesis to check for possible genetic problems.  Fetal screens for spina bifida and Down syndrome.  You may need other tests to make sure you and the baby are doing well.  HIV (human immunodeficiency virus) testing. Routine prenatal testing includes screening for HIV, unless you choose not to have this test. HOME CARE INSTRUCTIONS  Medicines  Follow your health care provider's instructions regarding medicine use. Specific medicines may be either safe or unsafe to take during pregnancy.  Take your prenatal vitamins as directed.  If you develop constipation, try taking a stool softener if your health care provider approves. Diet  Eat regular, well-balanced meals. Choose a variety of foods, such as meat or vegetable-based protein, fish, milk and low-fat dairy products, vegetables, fruits, and whole grain breads and cereals. Your health care provider will help you determine the amount of weight gain that is right for you.  Avoid raw meat and uncooked cheese. These carry germs that can cause birth defects in the baby.  Eating four or five small meals rather than three large meals a day may help relieve nausea and vomiting. If you start to feel nauseous, eating a few soda crackers can be helpful. Drinking liquids between meals instead of during meals also seems to help nausea and vomiting.  If you develop constipation, eat more high-fiber foods, such as fresh vegetables or fruit and whole grains. Drink enough fluids to keep your urine clear or pale yellow. Activity and Exercise  Exercise only as directed by your health care provider. Exercising will help you:  Control your weight.  Stay in shape.  Be prepared for labor and delivery.  Experiencing pain or cramping in the lower abdomen or low back is a good sign that you should stop exercising. Check with your health care provider before continuing normal exercises.  Try to avoid standing for long  periods of time. Move your legs often if you must stand in one place for a long time.  Avoid heavy lifting.  Wear low-heeled shoes, and practice good posture.  You may continue to have sex unless your health care provider directs you otherwise. Relief of Pain or Discomfort  Wear a good support bra for breast tenderness.   Take warm sitz baths to soothe any pain or discomfort caused by hemorrhoids. Use hemorrhoid cream if your health care provider approves.   Rest with your legs elevated if you have leg cramps or low back pain.  If you develop varicose veins in your legs, wear support hose. Elevate your feet for 15 minutes, 3-4 times a day. Limit salt in your diet. Prenatal Care  Schedule your prenatal visits by the twelfth week of pregnancy. They are usually scheduled monthly at first, then more often in the last 2 months before delivery.  Write down your questions. Take them to your prenatal visits.  Keep all your prenatal visits as directed by your   health care provider. Safety  Wear your seat belt at all times when driving.  Make a list of emergency phone numbers, including numbers for family, friends, the hospital, and police and fire departments. General Tips  Ask your health care provider for a referral to a local prenatal education class. Begin classes no later than at the beginning of month 6 of your pregnancy.  Ask for help if you have counseling or nutritional needs during pregnancy. Your health care provider can offer advice or refer you to specialists for help with various needs.  Do not use hot tubs, steam rooms, or saunas.  Do not douche or use tampons or scented sanitary pads.  Do not cross your legs for long periods of time.  Avoid cat litter boxes and soil used by cats. These carry germs that can cause birth defects in the baby and possibly loss of the fetus by miscarriage or stillbirth.  Avoid all smoking, herbs, alcohol, and medicines not prescribed by  your health care provider. Chemicals in these affect the formation and growth of the baby.  Do not use any tobacco products, including cigarettes, chewing tobacco, and electronic cigarettes. If you need help quitting, ask your health care provider. You may receive counseling support and other resources to help you quit.  Schedule a dentist appointment. At home, brush your teeth with a soft toothbrush and be gentle when you floss. SEEK MEDICAL CARE IF:   You have dizziness.  You have mild pelvic cramps, pelvic pressure, or nagging pain in the abdominal area.  You have persistent nausea, vomiting, or diarrhea.  You have a bad smelling vaginal discharge.  You have pain with urination.  You notice increased swelling in your face, hands, legs, or ankles. SEEK IMMEDIATE MEDICAL CARE IF:   You have a fever.  You are leaking fluid from your vagina.  You have spotting or bleeding from your vagina.  You have severe abdominal cramping or pain.  You have rapid weight gain or loss.  You vomit blood or material that looks like coffee grounds.  You are exposed to German measles and have never had them.  You are exposed to fifth disease or chickenpox.  You develop a severe headache.  You have shortness of breath.  You have any kind of trauma, such as from a fall or a car accident.   This information is not intended to replace advice given to you by your health care provider. Make sure you discuss any questions you have with your health care provider.   Document Released: 06/27/2001 Document Revised: 07/24/2014 Document Reviewed: 05/13/2013 Elsevier Interactive Patient Education 2016 [employer]  

## 2015-11-03 NOTE — Addendum Note (Signed)
Addended by: Gerome ApleyZEYFANG, LINDA L on: 11/03/2015 04:30 PM   Modules accepted: Orders

## 2015-11-03 NOTE — Progress Notes (Signed)
Urine unavailable  for urine culture and panel. Will do at next appointment.

## 2015-11-03 NOTE — Progress Notes (Signed)
   Subjective:    Brenda Young is a G3P2003 1037w6d being seen today for her first obstetrical visit.  Her obstetrical history is significant for increased orthostatic response at end of pregnancy, otherwise normal. Patient does not intend to breast feed. Pregnancy history fully reviewed.  Patient reports no complaints.  Filed Vitals:   11/03/15 1304  BP: 108/57  Pulse: 107  Weight: 207 lb (93.895 kg)    HISTORY: OB History  Gravida Para Term Preterm AB SAB TAB Ectopic Multiple Living  3 2 2  0 0 0 0 0 0 3    # Outcome Date GA Lbr Len/2nd Weight Sex Delivery Anes PTL Lv  3 Current           2 Term 09/16/14 6375w0d 10:48 / 01:18 8 lb 13 oz (3.997 kg) F Vag-Spont EPI Young Y  1 Term 02/20/12 644w0d  8 lb 2 oz (3.685 kg) F Vag-Spont EPI Y Y     Past Medical History  Diagnosis Date  . Depression   . Plantar fasciitis   . Normal labor 02/20/2012  . Anemia    Past Surgical History  Procedure Laterality Date  . Wisdom tooth extraction     Family History  Problem Relation Age of Onset  . Diabetes Brother   . Cancer Maternal Grandfather     prostate  . Stroke Paternal Grandfather   . Heart attack Paternal Grandfather      Exam   Filed Vitals:   11/03/15 1304  BP: 108/57  Pulse: 107   System: Breast:  No nipple retraction or dimpling, No nipple discharge or bleeding, No axillary or supraclavicular adenopathy, Normal to palpation without dominant masses   Skin: normal coloration and turgor, no rashes    Neurologic: negative   Extremities: normal strength, tone, and muscle mass   HEENT neck supple with midline trachea and thyroid without masses   Mouth/Teeth mucous membranes moist, pharynx normal without lesions   Neck supple and no masses   Cardiovascular: regular rate and rhythm, no murmurs or gallops   Respiratory:  appears well, vitals normal, no respiratory distress, acyanotic, normal RR, neck free of mass or lymphadenopathy, chest clear, no wheezing, crepitations,  rhonchi, normal symmetric air entry   Abdomen: soft, non-tender; bowel sounds normal; no masses,  no organomegaly      Assessment:    Pregnancy: [id number] Patient Active Problem List   Diagnosis Date Noted  . Supervision of high risk pregnancy, antepartum 11/03/2015        Plan:     Initial labs drawn. 1 hr today. Prenatal vitamins. Problem list reviewed and updated. Genetic Screening discussed First Screen: ordered.  [facility] [facility]   Marlis EdelsonKARIM, Brenda Young 11/03/2015

## 2015-11-03 NOTE — Progress Notes (Signed)
1 hour gtt given  Flu shot

## 2015-11-03 NOTE — Addendum Note (Signed)
Addended by: Gerome ApleyZEYFANG, LINDA L on: 11/03/2015 03:18 PM   Modules accepted: Orders

## 2015-11-04 LAB — PRENATAL PROFILE (SOLSTAS)
Antibody Screen: NEGATIVE
BASOS ABS: 0 {cells}/uL (ref 0–200)
BASOS PCT: 0 %
EOS ABS: 0 {cells}/uL — AB (ref 15–500)
EOS PCT: 0 %
HCT: 38.2 % (ref 35.0–45.0)
HEMOGLOBIN: 12.8 g/dL (ref 11.7–15.5)
HEP B S AG: NEGATIVE
HIV 1&2 Ab, 4th Generation: NONREACTIVE
LYMPHS ABS: 2112 {cells}/uL (ref 850–3900)
Lymphocytes Relative: 24 %
MCH: 27.4 pg (ref 27.0–33.0)
MCHC: 33.5 g/dL (ref 32.0–36.0)
MCV: 81.6 fL (ref 80.0–100.0)
MPV: 10.2 fL (ref 7.5–12.5)
Monocytes Absolute: 528 cells/uL (ref 200–950)
Monocytes Relative: 6 %
NEUTROS ABS: 6160 {cells}/uL (ref 1500–7800)
Neutrophils Relative %: 70 %
Platelets: 271 10*3/uL (ref 140–400)
RBC: 4.68 MIL/uL (ref 3.80–5.10)
RDW: 15.6 % — ABNORMAL HIGH (ref 11.0–15.0)
RUBELLA: 2.45 {index} — AB (ref <0.90)
Rh Type: NEGATIVE
WBC: 8.8 10*3/uL (ref 3.8–10.8)

## 2015-11-04 LAB — GC/CHLAMYDIA PROBE AMP (~~LOC~~) NOT AT ARMC
Chlamydia: NEGATIVE
Neisseria Gonorrhea: NEGATIVE

## 2015-11-04 LAB — GLUCOSE TOLERANCE, 1 HOUR (50G) W/O FASTING: Glucose, 1 Hr, gestational: 98 mg/dL (ref <140)

## 2015-11-08 ENCOUNTER — Encounter (HOSPITAL_COMMUNITY): Payer: Self-pay | Admitting: Family

## 2015-11-10 ENCOUNTER — Encounter: Payer: Self-pay | Admitting: Obstetrics and Gynecology

## 2015-11-10 DIAGNOSIS — O26899 Other specified pregnancy related conditions, unspecified trimester: Secondary | ICD-10-CM | POA: Insufficient documentation

## 2015-11-10 DIAGNOSIS — Z6791 Unspecified blood type, Rh negative: Secondary | ICD-10-CM | POA: Insufficient documentation

## 2015-11-17 ENCOUNTER — Encounter (HOSPITAL_COMMUNITY): Payer: Self-pay

## 2015-11-17 ENCOUNTER — Ambulatory Visit (HOSPITAL_COMMUNITY)
Admission: RE | Admit: 2015-11-17 | Discharge: 2015-11-17 | Disposition: A | Discharge status: Home / Self Care | Payer: Medicaid Other | Source: Ambulatory Visit | Attending: Family | Admitting: Family

## 2015-11-17 DIAGNOSIS — Z36 Encounter for antenatal screening of mother: Secondary | ICD-10-CM | POA: Insufficient documentation

## 2015-11-17 DIAGNOSIS — Z3A12 12 weeks gestation of pregnancy: Secondary | ICD-10-CM | POA: Insufficient documentation

## 2015-11-17 DIAGNOSIS — O0991 Supervision of high risk pregnancy, unspecified, first trimester: Secondary | ICD-10-CM | POA: Diagnosis not present

## 2015-11-17 DIAGNOSIS — O0990 Supervision of high risk pregnancy, unspecified, unspecified trimester: Secondary | ICD-10-CM

## 2015-11-24 ENCOUNTER — Other Ambulatory Visit (HOSPITAL_COMMUNITY): Payer: Self-pay

## 2015-12-03 ENCOUNTER — Ambulatory Visit (INDEPENDENT_AMBULATORY_CARE_PROVIDER_SITE_OTHER): Payer: Medicaid Other | Admitting: Family

## 2015-12-03 ENCOUNTER — Emergency Department
Admission: EM | Admit: 2015-12-03 | Discharge: 2015-12-03 | Discharge status: Absent without leave | Payer: Medicaid Other | Source: Home / Self Care

## 2015-12-03 VITALS — BP 110/78 | HR 153 | Wt 208.0 lb

## 2015-12-03 DIAGNOSIS — R000 Tachycardia, unspecified: Secondary | ICD-10-CM

## 2015-12-03 DIAGNOSIS — Z36 Encounter for antenatal screening of mother: Secondary | ICD-10-CM

## 2015-12-03 DIAGNOSIS — O0992 Supervision of high risk pregnancy, unspecified, second trimester: Secondary | ICD-10-CM

## 2015-12-03 DIAGNOSIS — Z3490 Encounter for supervision of normal pregnancy, unspecified, unspecified trimester: Secondary | ICD-10-CM

## 2015-12-03 DIAGNOSIS — Z3482 Encounter for supervision of other normal pregnancy, second trimester: Secondary | ICD-10-CM

## 2015-12-03 NOTE — Progress Notes (Signed)
Subjective:  Brenda Young is a 25 y.o. G3P2002 at 3433w1d being seen today for ongoing prenatal care.  She is currently monitored for the following issues for this low-risk pregnancy and has Supervision of high risk pregnancy, antepartum and Rh negative state in antepartum period on her problem list.  Patient reports reports having increased presure on chest that happens intermittently over the past 4 days.  Sensation lasts for hours.  Accompanied by difficulty breathing and feeling flushed.  Reports this does not feel like her typical anxiety attacks.   . Vag. Bleeding: None.  Movement: Absent. Denies leaking of fluid.   The following portions of the patient's history were reviewed and updated as appropriate: allergies, current medications, past family history, past medical history, past social history, past surgical history and problem list. Problem list updated.  Objective:   Filed Vitals:   12/03/15 0944  BP: 110/78  Pulse: 102  Weight: 208 lb (94.348 kg)    Fetal Status: Fetal Heart Rate (bpm): 160   Movement: Absent     General:  Alert, oriented and cooperative. Appears anxious  Skin: Skin is warm and flushed. No rash noted.   Cardiovascular: Tachycardia; no extra sounds or murmurs  Respiratory: Normal respiratory effort, no problems with respiration noted; lungs clear  Abdomen: Soft, gravid, appropriate for gestational age. Pain/Pressure: Absent     Pelvic: Vag. Bleeding: None Vag D/C Character: Thin   Cervical exam deferred        Extremities: Normal range of motion.  Edema: None  Mental Status: Normal mood and affect. Normal behavior. Normal judgment and thought content.   Urinalysis: Urine Protein: Negative Urine Glucose: Negative  Assessment and Plan:  Pregnancy: G3P2002 at 6433w1d  1. Prenatal care, unspecified trimester - CULTURE, URINE COMPREHENSIVE - Drug Screen, Urine  2. Supervision of high risk pregnancy, antepartum, second trimester - Order anatomy scan  3.   Tachycardia/Chest Pain - Patient taken next door to Urgent Care for further evaluation - Refer to cardiologist   General obstetric precautions including but not limited to vaginal bleeding and pelvic pain reviewed in detail with the patient. Please refer to After Visit Summary for other counseling recommendations.  Return in about 2 weeks (around 12/17/2015).   Eino FarberWalidah Kennith GainN Karim, CNM

## 2015-12-04 LAB — DRUG SCREEN, URINE
AMPHETAMINE SCRN UR: NEGATIVE
BARBITURATE QUANT UR: NEGATIVE
BENZODIAZEPINES.: NEGATIVE
COCAINE METABOLITES: NEGATIVE
CREATININE, U: 162.25 mg/dL
Marijuana Metabolite: NEGATIVE
Methadone: NEGATIVE
Opiates: NEGATIVE
Phencyclidine (PCP): NEGATIVE
Propoxyphene: NEGATIVE

## 2015-12-05 LAB — CULTURE, URINE COMPREHENSIVE
COLONY COUNT: NO GROWTH
ORGANISM ID, BACTERIA: NO GROWTH

## 2015-12-17 ENCOUNTER — Ambulatory Visit (INDEPENDENT_AMBULATORY_CARE_PROVIDER_SITE_OTHER): Payer: Medicaid Other | Admitting: Family

## 2015-12-17 VITALS — BP 92/60 | HR 132 | Wt 206.0 lb

## 2015-12-17 DIAGNOSIS — Z36 Encounter for antenatal screening of mother: Secondary | ICD-10-CM | POA: Diagnosis not present

## 2015-12-17 DIAGNOSIS — R000 Tachycardia, unspecified: Secondary | ICD-10-CM

## 2015-12-17 DIAGNOSIS — Z3482 Encounter for supervision of other normal pregnancy, second trimester: Secondary | ICD-10-CM

## 2015-12-17 DIAGNOSIS — O0992 Supervision of high risk pregnancy, unspecified, second trimester: Secondary | ICD-10-CM

## 2015-12-17 DIAGNOSIS — Z3492 Encounter for supervision of normal pregnancy, unspecified, second trimester: Secondary | ICD-10-CM

## 2015-12-17 NOTE — Progress Notes (Signed)
Subjective:  Brenda Young is a 25 y.o. G3P2002 at 6451w1d being seen today for ongoing prenatal care.  She is currently monitored for the following issues for this high-risk pregnancy and has Supervision of high risk pregnancy, antepartum; Rh negative state in antepartum period; and Tachycardia on her problem list.  Patient reports feeling better.  No chest pain or shortess of breath.  Seen in ED for recurrent tachycardia.  Informed patient she should be seen by MD in pregnancy.  Contractions: Not present. Vag. Bleeding: None.  Movement: Present. Denies leaking of fluid.   The following portions of the patient's history were reviewed and updated as appropriate: allergies, current medications, past family history, past medical history, past social history, past surgical history and problem list. Problem list updated.  Objective:   Filed Vitals:   12/17/15 0920  BP: 92/60  Pulse: 132  Weight: 206 lb (93.441 kg)    Fetal Status: Fetal Heart Rate (bpm): 157 Fundal Height: 18 cm Movement: Present     General:  Alert, oriented and cooperative. Patient is in no acute distress.  Skin: Skin is warm and dry. No rash noted.   Cardiovascular: Normal heart rate noted  Respiratory: Normal respiratory effort, no problems with respiration noted  Abdomen: Soft, gravid, appropriate for gestational age. Pain/Pressure: Absent     Pelvic: Vag. Bleeding: None Vag D/C Character: Thin   Cervical exam deferred        Extremities: Normal range of motion.  Edema: None  Mental Status: Normal mood and affect. Normal behavior. Normal judgment and thought content.   Urinalysis: Urine Protein: Negative Urine Glucose: Negative  Assessment and Plan:  Pregnancy: G3P2002 at 5751w1d  1. Normal pregnancy in second trimester - Alpha fetoprotein, maternal - Culture, OB Urine - Ultrasound on 12/31/15  2. Tachycardia - Appt with cardiologist on 01/19/16 - Instructed to go to ED for chest pain, SOB - Next appt with MD per  patient request  3. Supervision of high risk pregnancy, antepartum, second trimester - AFP today  Preterm labor symptoms and general obstetric precautions including but not limited to vaginal bleeding, contractions, leaking of fluid and fetal movement were reviewed in detail with the patient. Please refer to After Visit Summary for other counseling recommendations.  Return in about 4 weeks (around 01/14/2016) for MD visit (desires to see MD).   Eino FarberWalidah Kennith GainN Karim, CNM

## 2015-12-20 LAB — ALPHA FETOPROTEIN, MATERNAL
AFP: 29.6 ng/mL
Curr Gest Age: 17.1 weeks
MoM for AFP: 0.93
OPEN SPINA BIFIDA: NEGATIVE
Osb Risk: 1:17000 {titer}

## 2015-12-31 ENCOUNTER — Ambulatory Visit (HOSPITAL_COMMUNITY)
Admission: RE | Admit: 2015-12-31 | Discharge: 2015-12-31 | Disposition: A | Discharge status: Home / Self Care | Payer: Medicaid Other | Source: Ambulatory Visit | Attending: Family | Admitting: Family

## 2015-12-31 DIAGNOSIS — O0992 Supervision of high risk pregnancy, unspecified, second trimester: Secondary | ICD-10-CM | POA: Insufficient documentation

## 2015-12-31 DIAGNOSIS — Z3A19 19 weeks gestation of pregnancy: Secondary | ICD-10-CM | POA: Diagnosis not present

## 2016-01-01 NOTE — Addendum Note (Signed)
Encounter addended by: Marlis EdelsonWalidah N Karim, CNM on: 01/01/2016 11:50 AM<BR>     Documentation filed: Problem List

## 2016-01-11 NOTE — Progress Notes (Signed)
HPI: 25 year old female for evaluation of palpitations. Patient is approximately [redacted] weeks pregnant. She has had 2 previous pregnancies. She does state that she carries large babies and there was a problem with orthostatic hypotension causing syncope the last 1-2 weeks of each pregnancy (caused by compression of IVC). She typically does not have dyspnea on exertion, orthopnea, PND, pedal edema, syncope or exertional chest pain. Over the past 8 weeks she has had intermittent palpitations described as her heart racing. Her heart rate will be between 120-150. She was seen by her OB/GYN and apparently electrocardiograms were obtained but I do not have those results available. She was also [place] at the [place] and was told she did not have significant rhythm problems but did have a right bundle branch block. Her heart rate was in the 120 to 130 range. When she has these episodes she feels some discomfort in her chest and dyspnea. She was told at the hospital that she did not have a DVT.  Current Outpatient Prescriptions  Medication Sig Dispense Refill  . Misc. Devices (BREAST PUMP) MISC 1 Units by Does not apply route as needed. 1 each 0  . Prenatal Vit-Fe Fumarate-FA (PRENATAL MULTIVITAMIN) TABS Take 1 tablet by mouth daily. Reported on 11/03/2015     No current facility-administered medications for this visit.    Allergies  Allergen Reactions  . Avocado Anaphylaxis  . Citrullus Vulgaris Anaphylaxis    Any kind of melons     Past Medical History  Diagnosis Date  . Depression   . Plantar fasciitis   . Normal labor 02/20/2012  . Anemia   . Polycystic ovarian disease     Past Surgical History  Procedure Laterality Date  . Wisdom tooth extraction      Social History   Social History  . Marital Status: Married    Spouse Name: N/A  . Number of Children: 2  . Years of Education: N/A   Occupational History  .      Stay at home mother   Social History Main Topics    . Smoking status: Never Smoker   . Smokeless tobacco: Not on file  . Alcohol Use: No  . Drug Use: No  . Sexual Activity: Yes   Other Topics Concern  . Not on file   Social History Narrative    Family History  Problem Relation Age of Onset  . Diabetes Brother   . Cancer Maternal Grandfather     prostate  . Stroke Paternal Grandfather   . Heart attack Paternal Grandfather     ROS: no fevers or chills, productive cough, hemoptysis, dysphasia, odynophagia, melena, hematochezia, dysuria, hematuria, rash, seizure activity, orthopnea, PND, pedal edema, claudication. Remaining systems are negative.  Physical Exam:   Blood pressure 107/70, pulse 88, height 5\' 4"  (1.626 m), weight 212 lb 12.8 oz (96.525 kg), last menstrual period 08/19/2015, unknown if currently breastfeeding.  General:  Well developed/well nourished in NAD Skin warm/dry Patient not depressed No peripheral clubbing Back-normal HEENT-normal/normal eyelids Neck supple/normal carotid upstroke bilaterally; no bruits; no JVD; no thyromegaly chest - CTA/ normal expansion CV - RRR/normal S1 and S2; no murmurs, rubs or gallops;  PMI nondisplaced Abdomen -NT/ND, no HSM, no mass, + bowel sounds, no bruit 2+ femoral pulses, no bruits Ext-no edema, chords, 2+ DP Neuro-grossly nonfocal  ECG Sinus rhythm at a rate of 88. RV conduction delay.  Assessment and plan 1 palpitations-etiology unclear. We will obtain records from her OB/GYN and  recent [place]. Schedule of event monitor. Schedule echocardiogram. Her electrocardiogram here shows sinus rhythm with RV conduction delay. I doubt Brugada. No history of syncope. 2 20 week intrauterine pregnancy-management per OB.  Olga MillersBrian Crenshaw, MD

## 2016-01-12 ENCOUNTER — Encounter: Payer: Self-pay | Admitting: Obstetrics & Gynecology

## 2016-01-12 ENCOUNTER — Ambulatory Visit (INDEPENDENT_AMBULATORY_CARE_PROVIDER_SITE_OTHER): Payer: Medicaid Other | Admitting: Obstetrics & Gynecology

## 2016-01-12 VITALS — BP 124/77 | HR 100 | Temp 97.0°F | Wt 207.0 lb

## 2016-01-12 DIAGNOSIS — O360121 Maternal care for anti-D [Rh] antibodies, second trimester, fetus 1: Secondary | ICD-10-CM

## 2016-01-12 DIAGNOSIS — O0992 Supervision of high risk pregnancy, unspecified, second trimester: Secondary | ICD-10-CM

## 2016-01-12 DIAGNOSIS — R000 Tachycardia, unspecified: Secondary | ICD-10-CM

## 2016-01-12 NOTE — Progress Notes (Signed)
Subjective:  Brenda Young is a 25 y.o. G3P2002 at 3858w6d being seen today for ongoing prenatal care.  She is currently monitored for the following issues for this low-risk pregnancy and has Supervision of high risk pregnancy, antepartum; Rh negative state in antepartum period; and Tachycardia on her problem list.  Patient reports asymptomatic tachycardia that she sees when she check her at home monitor.  She denies SOB or DOE but, does not that her HR goes up with 'prolonged standing or activity' She reports that in her prev preg she was induced for 'passing out frequently'  Has had no LOC this pregnancy. .  Contractions: Not present. Vag. Bleeding: None.  Movement: Present. Denies leaking of fluid.   The following portions of the patient's history were reviewed and updated as appropriate: allergies, current medications, past family history, past medical history, past social history, past surgical history and problem list. Problem list updated.  Objective:   Filed Vitals:   01/12/16 1324  BP: 124/77  Pulse: 100  Temp: 97 F (36.1 C)  Weight: 207 lb (93.895 kg)    Fetal Status: Fetal Heart Rate (bpm): 159   Movement: Present     General:  Alert, oriented and cooperative. Patient is in no acute distress.  Skin: Skin is warm and dry. No rash noted.   Cardiovascular: Normal heart rate noted  Respiratory: Normal respiratory effort, no problems with respiration noted  Abdomen: Soft, gravid, appropriate for gestational age. Pain/Pressure: Absent     Pelvic: Cervical exam deferred        Extremities: Normal range of motion.  Edema: Trace  Mental Status: Normal mood and affect. Normal behavior. Normal judgment and thought content.   Urinalysis: Urine Protein: Trace Urine Glucose: Negative  Assessment and Plan:  Pregnancy: G3P2002 at 5658w6d  1. Supervision of high risk pregnancy, antepartum, second trimester  2. Rh negative state in antepartum period, second trimester, fetus 1 Needs  Rhogam at 28 weeks  3. Tachycardia Has appt with cardiology in ~2 weeks Pt had questions delivery options given tachycardia.  Have reviewed with her that at peresent there are no contraindications to a vaginal delivery.  Will review card consult once completed.    Preterm labor symptoms and general obstetric precautions including but not limited to vaginal bleeding, contractions, leaking of fluid and fetal movement were reviewed in detail with the patient. Please refer to After Visit Summary for other counseling recommendations.  Return in about 4 weeks (around 02/09/2016).   Willodean Rosenthalarolyn Harraway-Smith, MD

## 2016-01-12 NOTE — Patient Instructions (Signed)

## 2016-01-14 ENCOUNTER — Encounter: Payer: Medicaid Other | Admitting: Certified Nurse Midwife

## 2016-01-19 ENCOUNTER — Ambulatory Visit (INDEPENDENT_AMBULATORY_CARE_PROVIDER_SITE_OTHER): Payer: Medicaid Other | Admitting: Cardiology

## 2016-01-19 ENCOUNTER — Encounter: Payer: Self-pay | Admitting: *Deleted

## 2016-01-19 ENCOUNTER — Encounter: Payer: Self-pay | Admitting: Cardiology

## 2016-01-19 VITALS — BP 107/70 | HR 88 | Ht 64.0 in | Wt 212.8 lb

## 2016-01-19 DIAGNOSIS — R002 Palpitations: Secondary | ICD-10-CM | POA: Diagnosis not present

## 2016-01-19 NOTE — Progress Notes (Signed)
Patient ID: Brenda Young, female   DOB: February 14, 1991, 25 y.o.   MRN: [id number] Patient enrolled for [employer] to mail a 30 day cardiac event monitor to her home.

## 2016-01-19 NOTE — Patient Instructions (Signed)
Medication Instructions:   NO CHANGE  Testing/Procedures:  Your physician has requested that you have an echocardiogram. Echocardiography is a painless test that uses sound waves to create images of your heart. It provides your doctor with information about the size and shape of your heart and how well your heart's chambers and valves are working. This procedure takes approximately one hour. There are no restrictions for this procedure.   Your physician has recommended that you wear an event monitor. Event monitors are medical devices that record the heart's electrical activity. Doctors most often us these monitors to diagnose arrhythmias. Arrhythmias are problems with the speed or rhythm of the heartbeat. The monitor is a small, portable device. You can wear one while you do your normal daily activities. This is usually used to diagnose what is causing palpitations/syncope (passing out).    Follow-Up:  Your physician recommends that you schedule a follow-up appointment in: 6 WEEKS WITH DR Jens SomRENSHAW

## 2016-01-20 ENCOUNTER — Telehealth: Payer: Self-pay | Admitting: Cardiology

## 2016-01-20 NOTE — Telephone Encounter (Signed)
Faxed Release signed by patient to [facility] to obtain records per Dr Jens Somrenshaw request.  Faxed to [phone] 01/20/16. lp

## 2016-01-20 NOTE — Telephone Encounter (Signed)
Received records from [facility] per Dr Ludwig Clarksrenshaw's request.  Records given to Dr Jens Somrenshaw for review. lp

## 2016-01-24 ENCOUNTER — Ambulatory Visit (INDEPENDENT_AMBULATORY_CARE_PROVIDER_SITE_OTHER): Payer: Medicaid Other

## 2016-01-24 DIAGNOSIS — R002 Palpitations: Secondary | ICD-10-CM | POA: Diagnosis not present

## 2016-01-25 ENCOUNTER — Telehealth: Payer: Self-pay | Admitting: Cardiology

## 2016-01-25 NOTE — Telephone Encounter (Signed)
New Message   Pt call requesting to speak with RN. Pt states she received her monitor but needs to know how long it needs to be wore. Please call back to discuss

## 2016-01-25 NOTE — Telephone Encounter (Signed)
Returned call to patient. Advised her that she is to wear the heart monitor for 30 days. She said the instruction booklet made it very easy to put on. She did not have any other questions at this time.

## 2016-01-26 ENCOUNTER — Ambulatory Visit (HOSPITAL_BASED_OUTPATIENT_CLINIC_OR_DEPARTMENT_OTHER)
Admission: RE | Admit: 2016-01-26 | Discharge: 2016-01-26 | Disposition: A | Discharge status: Home / Self Care | Payer: Medicaid Other | Source: Ambulatory Visit | Attending: Cardiology | Admitting: Cardiology

## 2016-01-26 DIAGNOSIS — R002 Palpitations: Secondary | ICD-10-CM

## 2016-01-26 DIAGNOSIS — I340 Nonrheumatic mitral (valve) insufficiency: Secondary | ICD-10-CM | POA: Diagnosis not present

## 2016-01-26 NOTE — Progress Notes (Signed)
Echocardiogram 2D Echocardiogram has been performed.  Brenda Young, Brenda Young 01/26/2016, 4:19 PM

## 2016-01-28 ENCOUNTER — Telehealth: Payer: Self-pay | Admitting: Cardiology

## 2016-01-28 NOTE — Telephone Encounter (Signed)
Patient called in - she was notified of results by nurse yesterday but read on MyChart last night that the report showed mild mitral regurgitation. This was explained to patient, she was notified that MD was not concerned about this finding. Explained there is extra blood volume circulating when pregnant. Advised to continue wearing monitor for complete evaluation of her symptoms.

## 2016-01-28 NOTE — Telephone Encounter (Signed)
Please call,concerning her echo results. She did not understand it all on my-chart.

## 2016-02-10 ENCOUNTER — Ambulatory Visit (INDEPENDENT_AMBULATORY_CARE_PROVIDER_SITE_OTHER): Payer: Medicaid Other | Admitting: Obstetrics & Gynecology

## 2016-02-10 VITALS — BP 107/72 | HR 85 | Wt 209.0 lb

## 2016-02-10 DIAGNOSIS — O0992 Supervision of high risk pregnancy, unspecified, second trimester: Secondary | ICD-10-CM

## 2016-02-10 NOTE — Progress Notes (Signed)
Pt. had chest pain and saw a cardiologist. She is wearing a holter monitor. Pt is experiencing a loss of appetite.

## 2016-02-23 DIAGNOSIS — R002 Palpitations: Secondary | ICD-10-CM | POA: Insufficient documentation

## 2016-03-02 ENCOUNTER — Ambulatory Visit (INDEPENDENT_AMBULATORY_CARE_PROVIDER_SITE_OTHER): Payer: Medicaid Other | Admitting: Obstetrics & Gynecology

## 2016-03-02 VITALS — BP 120/79 | HR 124 | Wt 215.0 lb

## 2016-03-02 DIAGNOSIS — Z23 Encounter for immunization: Secondary | ICD-10-CM

## 2016-03-02 DIAGNOSIS — O360120 Maternal care for anti-D [Rh] antibodies, second trimester, not applicable or unspecified: Secondary | ICD-10-CM | POA: Diagnosis not present

## 2016-03-02 DIAGNOSIS — O0992 Supervision of high risk pregnancy, unspecified, second trimester: Secondary | ICD-10-CM

## 2016-03-02 DIAGNOSIS — O360121 Maternal care for anti-D [Rh] antibodies, second trimester, fetus 1: Secondary | ICD-10-CM

## 2016-03-02 MED ORDER — RHO D IMMUNE GLOBULIN 1500 UNIT/2ML IJ SOSY
300.0000 ug | PREFILLED_SYRINGE | Freq: Once | INTRAMUSCULAR | Status: AC
  Administered 2016-03-02: 300 ug via INTRAMUSCULAR

## 2016-03-02 NOTE — Progress Notes (Signed)
Subjective:  Brenda Young is a 25 y.o. MW G3P2002 at 10449w0d being seen today for ongoing prenatal care.  She is currently monitored for the following issues for this low-risk pregnancy and has Supervision of high risk pregnancy, antepartum; Rh negative state in antepartum period; Tachycardia; and Palpitations on her problem list.  Patient reports no complaints.  Contractions: Not present. Vag. Bleeding: None.  Movement: Present. Denies leaking of fluid.   The following portions of the patient's history were reviewed and updated as appropriate: allergies, current medications, past family history, past medical history, past social history, past surgical history and problem list. Problem list updated.  Objective:   Vitals:   03/02/16 1437  BP: 120/79  Pulse: (!) 124  Weight: 215 lb (97.5 kg)    Fetal Status: Fetal Heart Rate (bpm): 156   Movement: Present     General:  Alert, oriented and cooperative. Patient is in no acute distress.  Skin: Skin is warm and dry. No rash noted.   Cardiovascular: Normal heart rate noted  Respiratory: Normal respiratory effort, no problems with respiration noted  Abdomen: Soft, gravid, appropriate for gestational age. Pain/Pressure: Present     Pelvic:  Cervical exam deferred        Extremities: Normal range of motion.  Edema: Trace  Mental Status: Normal mood and affect. Normal behavior. Normal judgment and thought content.   Urinalysis: Urine Protein: Trace Urine Glucose: Negative  Assessment and Plan:  Pregnancy: G3P2002 at 7349w0d  1. Rh negative state in antepartum period, second trimester, fetus 1  - Glucose Tolerance, 1 HR (50g) - RPR - HIV antibody (with reflex) - CBC - Tdap vaccine greater than or equal to 7yo IM - rho (d) immune globulin (RHIG/RHOPHYLAC) injection 300 mcg; Inject 2 mLs (300 mcg total) into the muscle once. - Antibody screen; Future  2. Supervision of high risk pregnancy, antepartum, second trimester   Preterm labor  symptoms and general obstetric precautions including but not limited to vaginal bleeding, contractions, leaking of fluid and fetal movement were reviewed in detail with the patient. Please refer to After Visit Summary for other counseling recommendations.  Return in about 3 weeks (around 03/23/2016).   Allie BossierMyra C Richardson Dubree, MD

## 2016-03-03 ENCOUNTER — Telehealth: Payer: Self-pay | Admitting: *Deleted

## 2016-03-03 DIAGNOSIS — O0992 Supervision of high risk pregnancy, unspecified, second trimester: Secondary | ICD-10-CM

## 2016-03-03 LAB — CBC
HCT: 31 % — ABNORMAL LOW (ref 35.0–45.0)
Hemoglobin: 9.7 g/dL — ABNORMAL LOW (ref 11.7–15.5)
MCH: 25.9 pg — ABNORMAL LOW (ref 27.0–33.0)
MCHC: 31.3 g/dL — AB (ref 32.0–36.0)
MCV: 82.9 fL (ref 80.0–100.0)
MPV: 9.9 fL (ref 7.5–12.5)
PLATELETS: 222 10*3/uL (ref 140–400)
RBC: 3.74 MIL/uL — ABNORMAL LOW (ref 3.80–5.10)
RDW: 15.7 % — AB (ref 11.0–15.0)
WBC: 9.2 10*3/uL (ref 3.8–10.8)

## 2016-03-03 LAB — GLUCOSE TOLERANCE, 1 HOUR (50G) W/O FASTING: GLUCOSE, 1 HR, GESTATIONAL: 132 mg/dL (ref <140)

## 2016-03-03 LAB — HIV ANTIBODY (ROUTINE TESTING W REFLEX): HIV 1&2 Ab, 4th Generation: NONREACTIVE

## 2016-03-03 LAB — ANTIBODY SCREEN: ANTIBODY SCREEN: NEGATIVE

## 2016-03-03 LAB — RPR

## 2016-03-03 NOTE — Telephone Encounter (Signed)
Pt notified of normal 1 hr GTT.  Her Hgb was 9.7 she is to start an iron supplement.  She had this issue in her last pregnancy.

## 2016-03-16 ENCOUNTER — Ambulatory Visit (INDEPENDENT_AMBULATORY_CARE_PROVIDER_SITE_OTHER): Payer: Medicaid Other | Admitting: Obstetrics & Gynecology

## 2016-03-16 VITALS — BP 118/68 | HR 100 | Temp 97.1°F | Wt 217.0 lb

## 2016-03-16 DIAGNOSIS — O0993 Supervision of high risk pregnancy, unspecified, third trimester: Secondary | ICD-10-CM

## 2016-03-16 DIAGNOSIS — O360131 Maternal care for anti-D [Rh] antibodies, third trimester, fetus 1: Secondary | ICD-10-CM

## 2016-03-16 DIAGNOSIS — R002 Palpitations: Secondary | ICD-10-CM

## 2016-03-16 DIAGNOSIS — R000 Tachycardia, unspecified: Secondary | ICD-10-CM

## 2016-03-16 DIAGNOSIS — R42 Dizziness and giddiness: Secondary | ICD-10-CM | POA: Insufficient documentation

## 2016-03-16 DIAGNOSIS — O360130 Maternal care for anti-D [Rh] antibodies, third trimester, not applicable or unspecified: Secondary | ICD-10-CM

## 2016-03-16 MED ORDER — MECLIZINE HCL 32 MG PO TABS: 32.0000 mg | ORAL_TABLET | Freq: Three times a day (TID) | ORAL | 0 refills | Status: DC | PRN

## 2016-03-16 NOTE — Patient Instructions (Signed)

## 2016-03-16 NOTE — Progress Notes (Signed)
Pt is experiencing dizziness and nausea with vomiting

## 2016-03-16 NOTE — Progress Notes (Signed)
   PRENATAL VISIT NOTE  Subjective:  Brenda Young is a 25 y.o. G3P2002 at 7328w0d being seen today for ongoing prenatal care.  She is currently monitored for the following issues for this high-risk pregnancy and has Supervision of high risk pregnancy, antepartum; Rh negative state in antepartum period; Tachycardia; Palpitations; and Dizzy spells on her problem list.  Patient reports nausea and dizziness.  Contractions: Irritability. Vag. Bleeding: None.  Movement: Present. Denies leaking of fluid.   The following portions of the patient's history were reviewed and updated as appropriate: allergies, current medications, past family history, past medical history, past social history, past surgical history and problem list. Problem list updated.  Objective:   Vitals:   03/16/16 1402  BP: 118/68  Pulse: 100  Temp: 97.1 F (36.2 C)  Weight: 217 lb (98.4 kg)    Fetal Status: Fetal Heart Rate (bpm): 140's Fundal Height: 33 cm Movement: Present     General:  Alert, oriented and cooperative. Patient is in no acute distress.  Skin: Skin is warm and dry. No rash noted.   Cardiovascular: Normal heart rate noted  Respiratory: Normal respiratory effort, no problems with respiration noted  Abdomen: Soft, gravid, appropriate for gestational age. Pain/Pressure: Present     Pelvic:  Cervical exam deferred        Extremities: Normal range of motion.  Edema: Trace  Mental Status: Normal mood and affect. Normal behavior. Normal judgment and thought content.   Urinalysis: Urine Protein: Trace Urine Glucose: Negative  Assessment and Plan:  Pregnancy: G3P2002 at 7528w0d  1. Supervision of high risk pregnancy, antepartum, third trimester  2. Rh negative state in antepartum period, third trimester, fetus 1 S/p Rhogam  3. Palpitations  - meclizine (ANTIVERT) 32 MG tablet; Take 1 tablet (32 mg total) by mouth 3 (three) times daily as needed.  Dispense: 60 tablet; Refill: 0  4. Tachycardia  5.  Dizzy spells  - meclizine (ANTIVERT) 32 MG tablet; Take 1 tablet (32 mg total) by mouth 3 (three) times daily as needed.  Dispense: 60 tablet; Refill: 0 Pt s/p cardiac workup  Preterm labor symptoms and general obstetric precautions including but not limited to vaginal bleeding, contractions, leaking of fluid and fetal movement were reviewed in detail with the patient. Please refer to After Visit Summary for other counseling recommendations.  Return in about 2 weeks (around 03/30/2016).  Willodean Rosenthalarolyn Harraway-Smith, MD

## 2016-03-22 ENCOUNTER — Ambulatory Visit: Payer: Medicaid Other | Admitting: Cardiology

## 2016-03-23 ENCOUNTER — Other Ambulatory Visit: Payer: Self-pay | Admitting: *Deleted

## 2016-03-23 DIAGNOSIS — R42 Dizziness and giddiness: Secondary | ICD-10-CM

## 2016-03-23 DIAGNOSIS — R002 Palpitations: Secondary | ICD-10-CM

## 2016-03-23 MED ORDER — MECLIZINE HCL 32 MG PO TABS: 32.0000 mg | ORAL_TABLET | Freq: Three times a day (TID) | ORAL | 0 refills | Status: DC | PRN

## 2016-03-23 NOTE — Telephone Encounter (Signed)
Pt called stating that she has lost her RX for antivert and would like another RX sent to [facility] in [place].  RX sent per Dr Marice Potterove

## 2016-03-31 ENCOUNTER — Ambulatory Visit (INDEPENDENT_AMBULATORY_CARE_PROVIDER_SITE_OTHER): Payer: Medicaid Other | Admitting: Obstetrics and Gynecology

## 2016-03-31 VITALS — BP 116/75 | HR 108 | Wt 224.0 lb

## 2016-03-31 DIAGNOSIS — R42 Dizziness and giddiness: Secondary | ICD-10-CM

## 2016-03-31 DIAGNOSIS — O0993 Supervision of high risk pregnancy, unspecified, third trimester: Secondary | ICD-10-CM

## 2016-03-31 NOTE — Progress Notes (Signed)
   PRENATAL VISIT NOTE  Subjective:  Brenda Young is a 25 y.o. G3P2002 at 920w1d being seen today for ongoing prenatal care.  She is currently monitored for the following issues for this high-risk pregnancy and has Supervision of high risk pregnancy, antepartum; Rh negative state in antepartum period; Tachycardia; Palpitations; and Dizzy spells on her problem list.  Patient reports no bleeding, no cramping and no leaking.  Contractions: Irritability. Vag. Bleeding: None.  Movement: Present. Denies leaking of fluid.  Still have pre-syncopal symptoms and palpitations and taking Antivert. No syncope.   The following portions of the patient's history were reviewed and updated as appropriate: allergies, current medications, past family history, past medical history, past social history, past surgical history and problem list. Problem list updated.  Objective:   Vitals:   03/31/16 1016  BP: 116/75  Pulse: (!) 108  Weight: 224 lb (101.6 kg)    Fetal Status: Fetal Heart Rate (bpm): 147   Movement: Present     General:  Alert, oriented and cooperative. Patient is in no acute distress.  Skin: Skin is warm and dry. No rash noted.   Cardiovascular: Normal heart rate noted  Respiratory: Normal respiratory effort, no problems with respiration noted  Abdomen: Soft, gravid, appropriate for gestational age. Pain/Pressure: Present     Pelvic:  Cervical exam deferred        Extremities: Normal range of motion.  Edema: Trace  Mental Status: Normal mood and affect. Normal behavior. Normal judgment and thought content.   Urinalysis: Urine Protein: Trace Urine Glucose: Negative  Assessment and Plan:  Pregnancy: G3P2002 at 4420w1d Pre-syncope  There are no diagnoses linked to this encounter. Preterm labor symptoms and general obstetric precautions including but not limited to vaginal bleeding, contractions, leaking of fluid and fetal movement were reviewed in detail with the patient. Please refer to  After Visit Summary for other counseling recommendations.  Continue Antivert., limit activity when symptomatic and change positions gradually.  Return in 2 weeks (on 04/14/2016).  Danae Orleanseirdre C Poe, CNM

## 2016-03-31 NOTE — Patient Instructions (Signed)
Near-Syncope Near-syncope (commonly known as near fainting) is sudden weakness, dizziness, or feeling like you might pass out. During an episode of near-syncope, you may also develop pale skin, have tunnel vision, or feel sick to your stomach (nauseous). Near-syncope may occur when getting up after sitting or while standing for a long time. It is caused by a sudden decrease in blood flow to the brain. This decrease can result from various causes or triggers, most of which are not serious. However, because near-syncope can sometimes be a sign of something serious, a medical evaluation is required. The specific cause is often not determined. HOME CARE INSTRUCTIONS  Monitor your condition for any changes. The following actions may help to alleviate any discomfort you are experiencing:  Have someone stay with you until you feel stable.  Lie down right away and prop your feet up if you start feeling like you might faint. Breathe deeply and steadily. Wait until all the symptoms have passed. Most of these episodes last only a few minutes. You may feel tired for several hours.   Drink enough fluids to keep your urine clear or pale yellow.   If you are taking blood pressure or heart medicine, get up slowly when seated or lying down. Take several minutes to sit and then stand. This can reduce dizziness.  Follow up with your health care provider as directed. SEEK IMMEDIATE MEDICAL CARE IF:   You have a severe headache.   You have unusual pain in the chest, abdomen, or back.   You are bleeding from the mouth or rectum, or you have black or tarry stool.   You have an irregular or very fast heartbeat.   You have repeated fainting or have seizure-like jerking during an episode.   You faint when sitting or lying down.   You have confusion.   You have difficulty walking.   You have severe weakness.   You have vision problems.  MAKE SURE YOU:   Understand these instructions.  Will  watch your condition.  Will get help right away if you are not doing well or get worse.   This information is not intended to replace advice given to you by your health care provider. Make sure you discuss any questions you have with your health care provider.   Document Released: 07/03/2005 Document Revised: 07/08/2013 Document Reviewed: 12/06/2012 Elsevier Interactive Patient Education [employer]  Third Trimester of Pregnancy The third trimester is from week 29 through week 42, months 7 through 9. The third trimester is a time when the fetus is growing rapidly. At the end of the ninth month, the fetus is about 20 inches in length and weighs 6-10 pounds.  BODY CHANGES Your body goes through many changes during pregnancy. The changes vary from woman to woman.   Your weight will continue to increase. You can expect to gain 25-35 pounds (11-16 kg) by the end of the pregnancy.  You may begin to get stretch marks on your hips, abdomen, and breasts.  You may urinate more often because the fetus is moving lower into your pelvis and pressing on your bladder.  You may develop or continue to have heartburn as a result of your pregnancy.  You may develop constipation because certain hormones are causing the muscles that push waste through your intestines to slow down.  You may develop hemorrhoids or swollen, bulging veins (varicose veins).  You may have pelvic pain because of the weight gain and pregnancy hormones relaxing your joints  between the bones in your pelvis. Backaches may result from overexertion of the muscles supporting your posture.  You may have changes in your hair. These can include thickening of your hair, rapid growth, and changes in texture. Some women also have hair loss during or after pregnancy, or hair that feels dry or thin. Your hair will most likely return to normal after your baby is born.  Your breasts will continue to grow and be tender. A yellow discharge  may leak from your breasts called colostrum.  Your belly button may stick out.  You may feel short of breath because of your expanding uterus.  You may notice the fetus "dropping," or moving lower in your abdomen.  You may have a bloody mucus discharge. This usually occurs a few days to a week before labor begins.  Your cervix becomes thin and soft (effaced) near your due date. WHAT TO EXPECT AT YOUR PRENATAL EXAMS  You will have prenatal exams every 2 weeks until week 36. Then, you will have weekly prenatal exams. During a routine prenatal visit:  You will be weighed to make sure you and the fetus are growing normally.  Your blood pressure is taken.  Your abdomen will be measured to track your baby's growth.  The fetal heartbeat will be listened to.  Any test results from the previous visit will be discussed.  You may have a cervical check near your due date to see if you have effaced. At around 36 weeks, your caregiver will check your cervix. At the same time, your caregiver will also perform a test on the secretions of the vaginal tissue. This test is to determine if a type of bacteria, Group B streptococcus, is present. Your caregiver will explain this further. Your caregiver may ask you:  What your birth plan is.  How you are feeling.  If you are feeling the baby move.  If you have had any abnormal symptoms, such as leaking fluid, bleeding, severe headaches, or abdominal cramping.  If you are using any tobacco products, including cigarettes, chewing tobacco, and electronic cigarettes.  If you have any questions. Other tests or screenings that may be performed during your third trimester include:  Blood tests that check for low iron levels (anemia).  Fetal testing to check the health, activity level, and growth of the fetus. Testing is done if you have certain medical conditions or if there are problems during the pregnancy.  HIV (human immunodeficiency virus)  testing. If you are at high risk, you may be screened for HIV during your third trimester of pregnancy. FALSE LABOR You may feel small, irregular contractions that eventually go away. These are called Braxton Hicks contractions, or false labor. Contractions may last for hours, days, or even weeks before true labor sets in. If contractions come at regular intervals, intensify, or become painful, it is best to be seen by your caregiver.  SIGNS OF LABOR   Menstrual-like cramps.  Contractions that are 5 minutes apart or less.  Contractions that start on the top of the uterus and spread down to the lower abdomen and back.  A sense of increased pelvic pressure or back pain.  A watery or bloody mucus discharge that comes from the vagina. If you have any of these signs before the 37th week of pregnancy, call your caregiver right away. You need to go to the hospital to get checked immediately. HOME CARE INSTRUCTIONS   Avoid all smoking, herbs, alcohol, and unprescribed drugs. These chemicals affect  the formation and growth of the baby.  Do not use any tobacco products, including cigarettes, chewing tobacco, and electronic cigarettes. If you need help quitting, ask your health care provider. You may receive counseling support and other resources to help you quit.  Follow your caregiver's instructions regarding medicine use. There are medicines that are either safe or unsafe to take during pregnancy.  Exercise only as directed by your caregiver. Experiencing uterine cramps is a good sign to stop exercising.  Continue to eat regular, healthy meals.  Wear a good support bra for breast tenderness.  Do not use hot tubs, steam rooms, or saunas.  Wear your seat belt at all times when driving.  Avoid raw meat, uncooked cheese, cat litter boxes, and soil used by cats. These carry germs that can cause birth defects in the baby.  Take your prenatal vitamins.  Take 1500-2000 mg of calcium daily  starting at the 20th week of pregnancy until you deliver your baby.  Try taking a stool softener (if your caregiver approves) if you develop constipation. Eat more high-fiber foods, such as fresh vegetables or fruit and whole grains. Drink plenty of fluids to keep your urine clear or pale yellow.  Take warm sitz baths to soothe any pain or discomfort caused by hemorrhoids. Use hemorrhoid cream if your caregiver approves.  If you develop varicose veins, wear support hose. Elevate your feet for 15 minutes, 3-4 times a day. Limit salt in your diet.  Avoid heavy lifting, wear low heal shoes, and practice good posture.  Rest a lot with your legs elevated if you have leg cramps or low back pain.  Visit your dentist if you have not gone during your pregnancy. Use a soft toothbrush to brush your teeth and be gentle when you floss.  A sexual relationship may be continued unless your caregiver directs you otherwise.  Do not travel far distances unless it is absolutely necessary and only with the approval of your caregiver.  Take prenatal classes to understand, practice, and ask questions about the labor and delivery.  Make a trial run to the hospital.  Pack your hospital bag.  Prepare the baby's nursery.  Continue to go to all your prenatal visits as directed by your caregiver. SEEK MEDICAL CARE IF:  You are unsure if you are in labor or if your water has broken.  You have dizziness.  You have mild pelvic cramps, pelvic pressure, or nagging pain in your abdominal area.  You have persistent nausea, vomiting, or diarrhea.  You have a bad smelling vaginal discharge.  You have pain with urination. SEEK IMMEDIATE MEDICAL CARE IF:   You have a fever.  You are leaking fluid from your vagina.  You have spotting or bleeding from your vagina.  You have severe abdominal cramping or pain.  You have rapid weight loss or gain.  You have shortness of breath with chest pain.  You notice  sudden or extreme swelling of your face, hands, ankles, feet, or legs.  You have not felt your baby move in over an hour.  You have severe headaches that do not go away with medicine.  You have vision changes.   This information is not intended to replace advice given to you by your health care provider. Make sure you discuss any questions you have with your health care provider.   Document Released: 06/27/2001 Document Revised: 07/24/2014 Document Reviewed: 09/03/2012 Elsevier Interactive Patient Education [employer] Elsevier Inc.

## 2016-04-09 ENCOUNTER — Encounter (HOSPITAL_COMMUNITY): Payer: Self-pay | Admitting: *Deleted

## 2016-04-09 ENCOUNTER — Inpatient Hospital Stay (HOSPITAL_COMMUNITY)
Admission: AD | Admit: 2016-04-09 | Discharge: 2016-04-09 | Disposition: A | Discharge status: Home / Self Care | Payer: Medicaid Other | Source: Ambulatory Visit | Attending: Obstetrics & Gynecology | Admitting: Obstetrics & Gynecology

## 2016-04-09 DIAGNOSIS — E282 Polycystic ovarian syndrome: Secondary | ICD-10-CM | POA: Insufficient documentation

## 2016-04-09 DIAGNOSIS — D649 Anemia, unspecified: Secondary | ICD-10-CM | POA: Insufficient documentation

## 2016-04-09 DIAGNOSIS — O26893 Other specified pregnancy related conditions, third trimester: Secondary | ICD-10-CM | POA: Diagnosis not present

## 2016-04-09 DIAGNOSIS — O99283 Endocrine, nutritional and metabolic diseases complicating pregnancy, third trimester: Secondary | ICD-10-CM | POA: Diagnosis not present

## 2016-04-09 DIAGNOSIS — O99343 Other mental disorders complicating pregnancy, third trimester: Secondary | ICD-10-CM | POA: Diagnosis not present

## 2016-04-09 DIAGNOSIS — R200 Anesthesia of skin: Secondary | ICD-10-CM | POA: Diagnosis not present

## 2016-04-09 DIAGNOSIS — F329 Major depressive disorder, single episode, unspecified: Secondary | ICD-10-CM | POA: Diagnosis not present

## 2016-04-09 DIAGNOSIS — R42 Dizziness and giddiness: Secondary | ICD-10-CM | POA: Diagnosis not present

## 2016-04-09 DIAGNOSIS — O99013 Anemia complicating pregnancy, third trimester: Secondary | ICD-10-CM | POA: Diagnosis present

## 2016-04-09 DIAGNOSIS — Z3A33 33 weeks gestation of pregnancy: Secondary | ICD-10-CM | POA: Diagnosis not present

## 2016-04-09 NOTE — MAU Note (Signed)
Towards the end of her pregnancies, her BP drops. Is having those symptoms again.  Today, had a bad morning; had to sit down for shower.  Got very light headed, and nauseous.  Hands and feet went numb for a little bit.  Numbness is positional.

## 2016-04-09 NOTE — Discharge Instructions (Signed)
Orthostatic Hypotension Orthostatic hypotension is a sudden drop in blood pressure. It happens when you quickly stand up from a seated or lying position. You may feel dizzy or light-headed. This can last for just a few seconds or for up to a few minutes. It is usually not a serious problem. However, if this happens frequently or gets worse, it can be a sign of something more serious. CAUSES  Different things can cause orthostatic hypotension, including:   Loss of body fluids (dehydration).  Medicines that lower blood pressure.  Sudden changes in posture, such as standing up quickly after you have been sitting or lying down.  Taking too much of your medicine. SIGNS AND SYMPTOMS   Light-headedness or dizziness.   Fainting or near-fainting.   A fast heart rate.   Weakness.   Feeling tired (fatigue).  DIAGNOSIS  Your health care provider may do several things to help diagnose your condition and identify the cause. These may include:   Taking a medical history and doing a physical exam.  Checking your blood pressure. Your health care provider will check your blood pressure when you are:  Lying down.  Sitting.  Standing.  Using tilt table testing. In this test, you lie down on a table that moves from a lying position to a standing position. You will be strapped onto the table. This test monitors your blood pressure and heart rate when you are in different positions. TREATMENT  Treatment will vary depending on the cause. Possible treatments include:   Changing the dosage of your medicines.  Wearing compression stockings on your lower legs.  Standing up slowly after sitting or lying down.  Eating more salt.  Eating frequent, small meals.  In some cases, getting IV fluids.  Taking medicine to enhance fluid retention. HOME CARE INSTRUCTIONS  Only take over-the-counter or prescription medicines as directed by your health care provider.  Follow your health care  provider's instructions for changing the dosage of your current medicines.  Do not stop or adjust your medicine on your own.  Stand up slowly after sitting or lying down. This allows your body to adjust to the different position.  Wear compression stockings as directed.  Eat extra salt as directed.  Do not add extra salt to your diet unless directed to by your health care provider.  Eat frequent, small meals.  Avoid standing suddenly after eating.  Avoid hot showers or excessive heat as directed by your health care provider.  Keep all follow-up appointments. SEEK MEDICAL CARE IF:  You continue to feel dizzy or light-headed after standing.  You feel groggy or confused.  You feel cold, clammy, or sick to your stomach (nauseous).  You have blurred vision.  You feel short of breath. SEEK IMMEDIATE MEDICAL CARE IF:   You faint after standing.  You have chest pain.  You have difficulty breathing.   You lose feeling or movement in your arms or legs.   You have slurred speech or difficulty talking, or you are unable to talk.  MAKE SURE YOU:   Understand these instructions.  Will watch your condition.  Will get help right away if you are not doing well or get worse.   This information is not intended to replace advice given to you by your health care provider. Make sure you discuss any questions you have with your health care provider.   Document Released: 06/23/2002 Document Revised: 07/08/2013 Document Reviewed: 04/25/2013 Elsevier Interactive Patient Education 2016 [employer].  Pregnancy and  Anemia Anemia is a condition in which the concentration of red blood cells or hemoglobin in the blood is below normal. Hemoglobin is a substance in red blood cells that carries oxygen to the tissues of the body. Anemia results in not enough oxygen reaching these tissues.  Anemia during pregnancy is common because the fetus uses more iron and folic acid as it is developing.  Your body may not produce enough red blood cells because of this. Also, during pregnancy, the liquid part of the blood (plasma) increases by about 50%, and the red blood cells increase by only 25%. This lowers the concentration of the red blood cells and creates a natural anemia-like situation.  CAUSES  The most common cause of anemia during pregnancy is not having enough iron in the body to make red blood cells (iron deficiency anemia). Other causes may include:  Folic acid deficiency.  Vitamin B12 deficiency.  Certain prescription or over-the-counter medicines.  Certain medical conditions or infections that destroy red blood cells.  A low platelet count and bleeding caused by antibodies that go through the placenta to the fetus from the mother's blood. SIGNS AND SYMPTOMS  Mild anemia may not be noticeable. If it becomes severe, symptoms may include:  Tiredness.  Shortness of breath, especially with exercise.  Weakness.  Fainting.  Pale looking skin.  Headaches.  Feeling a fast or irregular heartbeat (palpitations). DIAGNOSIS  The type of anemia is usually diagnosed from your family and medical history and blood tests. TREATMENT  Treatment of anemia during pregnancy depends on the cause of the anemia. Treatment can include:  Supplements of iron, vitamin B12, or folic acid.  A blood transfusion. This may be needed if blood loss is severe.  Hospitalization. This may be needed if there is significant continual blood loss.  Dietary changes. HOME CARE INSTRUCTIONS   Follow your dietitian's or health care provider's dietary recommendations.  Increase your vitamin C intake. This will help the stomach absorb more iron.  Eat a diet rich in iron. This would include foods such as:  Liver.  Beef.  Whole grain bread.  Eggs.  Dried fruit.  Take iron and vitamins as directed by your health care provider.  Eat green leafy vegetables. These are a good source of folic  acid. SEEK MEDICAL CARE IF:   You have frequent or lasting headaches.  You are looking pale.  You are bruising easily. SEEK IMMEDIATE MEDICAL CARE IF:   You have extreme weakness, shortness of breath, or chest pain.  You become dizzy or have trouble concentrating.  You have heavy vaginal bleeding.  You develop a rash.  You have bloody or black, tarry stools.  You faint.  You vomit up blood.  You vomit repeatedly.  You have abdominal pain.  You have a fever or persistent symptoms for more than 2-3 days.  You have a fever and your symptoms suddenly get worse.  You are dehydrated. MAKE SURE YOU:   Understand these instructions.  Will watch your condition.  Will get help right away if you are not doing well or get worse.   This information is not intended to replace advice given to you by your health care provider. Make sure you discuss any questions you have with your health care provider.   Document Released: 06/30/2000 Document Revised: 04/23/2013 Document Reviewed: 02/12/2013 Elsevier Interactive Patient Education [employer]2016 Elsevier Inc.

## 2016-04-09 NOTE — MAU Provider Note (Signed)
Chief Complaint:  No chief complaint on file.   First Provider Initiated Contact with Patient 04/09/16 1455      HPI: Brenda Young is a 25 y.o. G3P2002 at 3761w3d who presents to maternity admissions reporting low blood pressures. She reports having episodic symptomatic low blood pressures during last pregnancy and syncope. Reports that she is now starting to have the same problem, but has not had any syncope. Has not checked BP at home, but states she is sometimes getting the same symptoms (lightheadedness and nausea). Today, she felt lightheaded, nauseous, and has numbness on all 4 extremities after getting out of a warm shower. Denies any palpitations. She called nurses and came in d/t concern of new symptom of numbness. States she feels better now. Denies contractions, leakage of fluid or vaginal bleeding. Good fetal movement. She is anemic and reports not being very compliant with iron d/t side effect of constipation. Denies any recent bleeding.   [facility]  Past Medical History: Past Medical History:  Diagnosis Date  . Anemia   . Depression   . Normal labor 02/20/2012  . Plantar fasciitis   . Polycystic ovarian disease     Past obstetric history: OB History  Gravida Para Term Preterm AB Living  3 2 2  0 0 2  SAB TAB Ectopic Multiple Live Births  0 0 0 0 2    # Outcome Date GA Lbr Len/2nd Weight Sex Delivery Anes PTL Lv  3 Current           2 Term 09/16/14 3047w0d 10:48 / 01:18 8 lb 13 oz (3.997 kg) F Vag-Spont EPI N LIV  1 Term 02/20/12 9444w0d  8 lb 2 oz (3.685 kg) F Vag-Spont EPI Y LIV      Past Surgical History: Past Surgical History:  Procedure Laterality Date  . WISDOM TOOTH EXTRACTION       Family History: Family History  Problem Relation Age of Onset  . Diabetes Brother   . Cancer Maternal Grandfather     prostate  . Stroke Paternal Grandfather   . Heart attack Paternal Grandfather     Social History: Social History  Substance Use Topics   . Smoking status: Never Smoker  . Smokeless tobacco: Never Used  . Alcohol use No    Allergies:  Allergies  Allergen Reactions  . Avocado Anaphylaxis  . Citrullus Vulgaris Anaphylaxis    Any kind of melons    Meds:  Prescriptions Prior to Admission  Medication Sig Dispense Refill Last Dose  . albuterol (PROVENTIL HFA;VENTOLIN HFA) 108 (90 Base) MCG/ACT inhaler Inhale into the lungs.     Marland Kitchen. albuterol (PROVENTIL) (2.5 MG/3ML) 0.083% nebulizer solution 2.5 mg.     . meclizine (ANTIVERT) 32 MG tablet Take 1 tablet (32 mg total) by mouth 3 (three) times daily as needed. 60 tablet 0   . Misc. Devices (BREAST PUMP) MISC 1 Units by Does not apply route as needed. 1 each 0 Taking  . Prenatal Vit-Fe Fumarate-FA (PRENATAL MULTIVITAMIN) TABS Take 1 tablet by mouth daily. Reported on 11/03/2015   Taking    I have reviewed patient's Past Medical Hx, Surgical Hx, Family Hx, Social Hx, medications and allergies.   ROS:  A comprehensive ROS was negative except per HPI.    Physical Exam   Patient Vitals for the past 24 hrs:  BP Temp Temp src Pulse Resp SpO2 Weight  04/09/16 1520 120/79 - - 97 18 - -  04/09/16 1444  120/79 - - 97 - - -  04/09/16 1441 - - - 103 - - -  04/09/16 1436 - - - 107 - - -  04/09/16 1431 - - - 103 - - -  04/09/16 1429 112/73 - - 100 - - -  04/09/16 1426 - - - 102 - - -  04/09/16 1421 - - - 110 - - -  04/09/16 1416 - - - 112 - - -  04/09/16 1414 115/74 - - 111 - - -  04/09/16 1411 - - - 114 - - -  04/09/16 1407 109/75 - - 101 - - -  04/09/16 1406 109/64 - - 107 - 97 % -  04/09/16 1352 121/83 98.2 F (36.8 C) Oral 116 18 98 % 226 lb 12 oz (102.9 kg)   Constitutional: Well-developed, well-nourished female in no acute distress.  Cardiovascular: normal rate, regular rhythm; peripheral pulses 2+ on all 4 extremities Respiratory: normal effort GI: Abd soft, non-tender, gravid appropriate for gestational age.  MS: Extremities nontender, no edema, normal  ROM Neurologic: Alert and oriented x 4.  Psych: normal mood and appropriate affect    FHT:  Baseline 135 , moderate variability, accelerations present, no decelerations Contractions: none   Labs: No results found for this or any previous visit (from the past 24 hour(s)).  Imaging:  No results found.  MAU Course: - Pt seen and evaluated. Serial BPs all normal. Currently does not have any symtomps  MDM: Plan of care reviewed with patient, including labs and tests ordered and medical treatment.   Assessment: 25 y.o. [id number] with IUP at [redacted]w[redacted]d and h/o increased orthostatic response with her pregnancies, who presents after episode of lightheadedness and nausea that occurred after getting out of a warm shower. Also h/o anemia with noncompliance with iron pills, but no bleeding.   Plan: - Discharge home in stable condition.  - Discussed precautions, increased water intake, avoiding being in warm shower for long periods of time, and compliance with iron pills (advised to take colace and miralax daily for constipation).   Frederik Pear, MD 04/09/2016 3:14 PM

## 2016-04-14 ENCOUNTER — Ambulatory Visit (INDEPENDENT_AMBULATORY_CARE_PROVIDER_SITE_OTHER): Payer: Medicaid Other | Admitting: Family

## 2016-04-14 VITALS — BP 114/80 | HR 122 | Wt 227.0 lb

## 2016-04-14 DIAGNOSIS — O360131 Maternal care for anti-D [Rh] antibodies, third trimester, fetus 1: Secondary | ICD-10-CM

## 2016-04-14 DIAGNOSIS — O360130 Maternal care for anti-D [Rh] antibodies, third trimester, not applicable or unspecified: Secondary | ICD-10-CM

## 2016-04-14 DIAGNOSIS — O0993 Supervision of high risk pregnancy, unspecified, third trimester: Secondary | ICD-10-CM

## 2016-04-14 NOTE — Progress Notes (Signed)
   PRENATAL VISIT NOTE  Subjective:  Brenda Young is a 25 y.o. G3P2002 at 7147w1d being seen today for ongoing prenatal care.  She is currently monitored for the following issues for this high-risk pregnancy and has Supervision of high risk pregnancy, antepartum; Rh negative state in antepartum period; Tachycardia; Palpitations; and Dizzy spells on her problem list.  Patient reports continued near syncopal symptoms, increased iron..  Contractions: Not present. Vag. Bleeding: None.  Movement: Present. Denies leaking of fluid.   The following portions of the patient's history were reviewed and updated as appropriate: allergies, current medications, past family history, past medical history, past social history, past surgical history and problem list. Problem list updated.  Objective:   Vitals:   04/14/16 1012  BP: 114/80  Pulse: (!) 122  Weight: 227 lb (103 kg)    Fetal Status: Fetal Heart Rate (bpm): 141 Fundal Height: 36 cm Movement: Present     General:  Alert, oriented and cooperative. Patient is in no acute distress.  Skin: Skin is warm and dry. No rash noted.   Cardiovascular: Normal heart rate noted  Respiratory: Normal respiratory effort, no problems with respiration noted  Abdomen: Soft, gravid, appropriate for gestational age. Pain/Pressure: Present     Pelvic:  Cervical exam deferred        Extremities: Normal range of motion.  Edema: Trace  Mental Status: Normal mood and affect. Normal behavior. Normal judgment and thought content.   Urinalysis: Urine Protein: Negative Urine Glucose: Negative  Assessment and Plan:  Pregnancy: G3P2002 at 4047w1d  1. Supervision of high risk pregnancy, antepartum, third trimester - Concerned about tearing; recommended coconut oil and stretching of perineum  2. Rh negative state in antepartum period, third trimester, fetus 1 - Received Rhophylac  Preterm labor symptoms and general obstetric precautions including but not limited to  vaginal bleeding, contractions, leaking of fluid and fetal movement were reviewed in detail with the patient. Please refer to After Visit Summary for other counseling recommendations.  Return in about 2 weeks (around 04/28/2016).  Eino FarberWalidah Kennith GainN Karim, CNM

## 2016-04-25 NOTE — Progress Notes (Signed)
   PRENATAL VISIT NOTE  Subjective:  Brenda Young is a 25 y.o. G3P2002 at 3518w5d being seen today for ongoing prenatal care.  She is currently monitored for the following issues for this low-risk pregnancy and has Supervision of high risk pregnancy, antepartum; Rh negative state in antepartum period; Tachycardia; Palpitations; and Dizzy spells on her problem list.  Patient reports no complaints.  Contractions: Not present. Vag. Bleeding: None.  Movement: Present. Denies leaking of fluid.   The following portions of the patient's history were reviewed and updated as appropriate: allergies, current medications, past family history, past medical history, past social history, past surgical history and problem list. Problem list updated.  Objective:   Vitals:   02/10/16 1547  BP: 107/72  Pulse: 85  Weight: 209 lb (94.8 kg)    Fetal Status:     Movement: Present     General:  Alert, oriented and cooperative. Patient is in no acute distress.  Skin: Skin is warm and dry. No rash noted.   Cardiovascular: Normal heart rate noted  Respiratory: Normal respiratory effort, no problems with respiration noted  Abdomen: Soft, gravid, appropriate for gestational age. Pain/Pressure: Absent     Pelvic:  Cervical exam deferred        Extremities: Normal range of motion.  Edema: Trace  Mental Status: Normal mood and affect. Normal behavior. Normal judgment and thought content.   Urinalysis: Urine Protein: Trace Urine Glucose: Negative  Assessment and Plan:  Pregnancy: G3P2002 at 4518w5d  1. Supervision of high risk pregnancy, antepartum, second trimester  Preterm labor symptoms and general obstetric precautions including but not limited to vaginal bleeding, contractions, leaking of fluid and fetal movement were reviewed in detail with the patient. Please refer to After Visit Summary for other counseling recommendations.  Return in about 3 years (around 02/10/2019) for glucola.  Brenda BossierMyra C Sahana Boyland, MD

## 2016-04-27 ENCOUNTER — Other Ambulatory Visit (HOSPITAL_COMMUNITY)
Admission: RE | Admit: 2016-04-27 | Discharge: 2016-04-27 | Disposition: A | Discharge status: Home / Self Care | Payer: Medicaid Other | Source: Ambulatory Visit | Attending: Obstetrics & Gynecology | Admitting: Obstetrics & Gynecology

## 2016-04-27 ENCOUNTER — Ambulatory Visit (INDEPENDENT_AMBULATORY_CARE_PROVIDER_SITE_OTHER): Payer: Medicaid Other | Admitting: Obstetrics & Gynecology

## 2016-04-27 VITALS — BP 126/76 | HR 101 | Wt 232.0 lb

## 2016-04-27 DIAGNOSIS — Z113 Encounter for screening for infections with a predominantly sexual mode of transmission: Secondary | ICD-10-CM | POA: Diagnosis present

## 2016-04-27 DIAGNOSIS — Z3493 Encounter for supervision of normal pregnancy, unspecified, third trimester: Secondary | ICD-10-CM

## 2016-04-27 LAB — OB RESULTS CONSOLE GBS: STREP GROUP B AG: NEGATIVE

## 2016-04-27 NOTE — Patient Instructions (Signed)
Return to clinic for any scheduled appointments or obstetric concerns, or go to MAU for evaluation  

## 2016-04-27 NOTE — Progress Notes (Signed)
   PRENATAL VISIT NOTE  Subjective:  Brenda Young is a 25 y.o. G3P2002 at 869w0d being seen today for ongoing prenatal care.  She is currently monitored for the following issues for this low-risk pregnancy and has Supervision of normal pregnancy; Rh negative state in antepartum period; Tachycardia; Palpitations; and Dizzy spells on her problem list.  Patient reports no complaints.  Contractions: Irritability. Vag. Bleeding: None.  Movement: Present. Denies leaking of fluid.   The following portions of the patient's history were reviewed and updated as appropriate: allergies, current medications, past family history, past medical history, past social history, past surgical history and problem list. Problem list updated.  Objective:   Vitals:   04/27/16 1534  BP: 126/76  Pulse: (!) 101  Weight: 232 lb (105.2 kg)    Fetal Status: Fetal Heart Rate (bpm): 145 Fundal Height: 37 cm Movement: Present  Presentation: Vertex  General:  Alert, oriented and cooperative. Patient is in no acute distress.  Skin: Skin is warm and dry. No rash noted.   Cardiovascular: Normal heart rate noted  Respiratory: Normal respiratory effort, no problems with respiration noted  Abdomen: Soft, gravid, appropriate for gestational age. Pain/Pressure: Present     Pelvic:  Cervical exam performed Dilation: 1 Effacement (%): Thick Station: Ballotable  Extremities: Normal range of motion.  Edema: None  Mental Status: Normal mood and affect. Normal behavior. Normal judgment and thought content.   Urinalysis: Urine Protein: Negative Urine Glucose: Negative  Assessment and Plan:  Pregnancy: G3P2002 at 2569w0d  1. Normal pregnancy in third trimester Pelvic cultures done today - Culture, beta strep (group b only) - Urine cytology ancillary only Preterm labor symptoms and general obstetric precautions including but not limited to vaginal bleeding, contractions, leaking of fluid and fetal movement were reviewed in  detail with the patient. Please refer to After Visit Summary for other counseling recommendations.  Return in about 1 week (around 05/04/2016) for OB Visit.  Tereso NewcomerUgonna A Anyanwu, MD

## 2016-04-29 LAB — CULTURE, BETA STREP (GROUP B ONLY)

## 2016-05-01 ENCOUNTER — Telehealth: Payer: Self-pay

## 2016-05-01 LAB — URINE CYTOLOGY ANCILLARY ONLY
CHLAMYDIA, DNA PROBE: NEGATIVE
NEISSERIA GONORRHEA: NEGATIVE

## 2016-05-01 NOTE — Telephone Encounter (Signed)
Pt has a history of passing out during her pregnancies and Walidah wanted the pt to call and report to us if she passed out at any point. Pt stated that today (05/01/2016) she passed out around 10:00 or 10:15am. Pt stated that she could feel it coming so she laid down on her couch so she would not fall. Pt stated that she feels better now and is comfortable waiting until Friday ( 05/05/2016) for her previously scheduled appt.

## 2016-05-05 ENCOUNTER — Ambulatory Visit (INDEPENDENT_AMBULATORY_CARE_PROVIDER_SITE_OTHER): Payer: Medicaid Other | Admitting: Advanced Practice Midwife

## 2016-05-05 DIAGNOSIS — Z3483 Encounter for supervision of other normal pregnancy, third trimester: Secondary | ICD-10-CM

## 2016-05-05 NOTE — Patient Instructions (Signed)
Braxton Hicks Contractions °Contractions of the uterus can occur throughout pregnancy. Contractions are not always a sign that you are in labor.  °WHAT ARE BRAXTON HICKS CONTRACTIONS?  °Contractions that occur before labor are called Braxton Hicks contractions, or false labor. Toward the end of pregnancy (32-34 weeks), these contractions can develop more often and may become more forceful. This is not true labor because these contractions do not result in opening (dilatation) and thinning of the cervix. They are sometimes difficult to tell apart from true labor because these contractions can be forceful and people have different pain tolerances. You should not feel embarrassed if you go to the hospital with false labor. Sometimes, the only way to tell if you are in true labor is for your health care provider to look for changes in the cervix. °If there are no prenatal problems or other health problems associated with the pregnancy, it is completely safe to be sent home with false labor and await the onset of true labor. °HOW CAN YOU TELL THE DIFFERENCE BETWEEN TRUE AND FALSE LABOR? °False Labor °· The contractions of false labor are usually shorter and not as hard as those of true labor.   °· The contractions are usually irregular.   °· The contractions are often felt in the front of the lower abdomen and in the groin.   °· The contractions may go away when you walk around or change positions while lying down.   °· The contractions get weaker and are shorter lasting as time goes on.   °· The contractions do not usually become progressively stronger, regular, and closer together as with true labor.   °True Labor °· Contractions in true labor last 30-70 seconds, become very regular, usually become more intense, and increase in frequency.   °· The contractions do not go away with walking.   °· The discomfort is usually felt in the top of the uterus and spreads to the lower abdomen and low back.   °· True labor can be  determined by your health care provider with an exam. This will show that the cervix is dilating and getting thinner.   °WHAT TO REMEMBER °· Keep up with your usual exercises and follow other instructions given by your health care provider.   °· Take medicines as directed by your health care provider.   °· Keep your regular prenatal appointments.   °· Eat and drink lightly if you think you are going into labor.   °· If Braxton Hicks contractions are making you uncomfortable:   °¨ Change your position from lying down or resting to walking, or from walking to resting.   °¨ Sit and rest in a tub of warm water.   °¨ Drink 2-3 glasses of water. Dehydration may cause these contractions.   °¨ Do slow and deep breathing several times an hour.   °WHEN SHOULD I SEEK IMMEDIATE MEDICAL CARE? °Seek immediate medical care if: °· Your contractions become stronger, more regular, and closer together.   °· You have fluid leaking or gushing from your vagina.   °· You have a fever.   °· You pass blood-tinged mucus.   °· You have vaginal bleeding.   °· You have continuous abdominal pain.   °· You have low back pain that you never had before.   °· You feel your baby's head pushing down and causing pelvic pressure.   °· Your baby is not moving as much as it used to.   °  °This information is not intended to replace advice given to you by your health care provider. Make sure you discuss any questions you have with your health care   provider. °  °Document Released: 07/03/2005 Document Revised: 07/08/2013 Document Reviewed: 04/14/2013 °Elsevier Interactive Patient Education ©2016 [employer] Inc[employer] ° °

## 2016-05-05 NOTE — Progress Notes (Signed)
   PRENATAL VISIT NOTE  Subjective:  Brenda Young is a 25 y.o. G3P2002 at 6361w1d being seen today for ongoing prenatal care.  She is currently monitored for the following issues for this low-risk pregnancy and has Supervision of normal pregnancy; Rh negative state in antepartum period; Tachycardia; Palpitations; and Dizzy spells on her problem list.  Patient reports occasional contractions.  Contractions: Irritability. Vag. Bleeding: None.  Movement: Present. Denies leaking of fluid.   The following portions of the patient's history were reviewed and updated as appropriate: allergies, current medications, past family history, past medical history, past social history, past surgical history and problem list. Problem list updated.  Objective:   Vitals:   05/05/16 1108  BP: 112/70  Pulse: (!) 127  Weight: 233 lb (105.7 kg)    Fetal Status: Fetal Heart Rate (bpm): 150   Movement: Present     General:  Alert, oriented and cooperative. Patient is in no acute distress.  Skin: Skin is warm and dry. No rash noted.   Cardiovascular: Normal heart rate noted  Respiratory: Normal respiratory effort, no problems with respiration noted  Abdomen: Soft, gravid, appropriate for gestational age. Pain/Pressure: Present     Pelvic:  Cervical exam performed      1/0/-3  Extremities: Normal range of motion.  Edema: None  Mental Status: Normal mood and affect. Normal behavior. Normal judgment and thought content.   Assessment and Plan:  Pregnancy: G3P2002 at 661w1d  1. Encounter for supervision of other normal pregnancy in third trimester   Term labor symptoms and general obstetric precautions including but not limited to vaginal bleeding, contractions, leaking of fluid and fetal movement were reviewed in detail with the patient. Please refer to After Visit Summary for other counseling recommendations.  F/U  1 week  [place] Rider Erm[place], [place]

## 2016-05-09 ENCOUNTER — Ambulatory Visit (INDEPENDENT_AMBULATORY_CARE_PROVIDER_SITE_OTHER): Payer: Medicaid Other | Admitting: Obstetrics & Gynecology

## 2016-05-09 VITALS — BP 132/85 | HR 91 | Wt 232.0 lb

## 2016-05-09 DIAGNOSIS — R002 Palpitations: Secondary | ICD-10-CM

## 2016-05-09 DIAGNOSIS — O9989 Other specified diseases and conditions complicating pregnancy, childbirth and the puerperium: Secondary | ICD-10-CM

## 2016-05-09 DIAGNOSIS — O26899 Other specified pregnancy related conditions, unspecified trimester: Secondary | ICD-10-CM

## 2016-05-09 DIAGNOSIS — R42 Dizziness and giddiness: Secondary | ICD-10-CM

## 2016-05-09 DIAGNOSIS — R000 Tachycardia, unspecified: Secondary | ICD-10-CM

## 2016-05-09 DIAGNOSIS — Z6791 Unspecified blood type, Rh negative: Secondary | ICD-10-CM

## 2016-05-09 DIAGNOSIS — Z3403 Encounter for supervision of normal first pregnancy, third trimester: Secondary | ICD-10-CM

## 2016-05-09 NOTE — Progress Notes (Signed)
   PRENATAL VISIT NOTE  Subjective:  Brenda Young is a 25 y.o. G3P2002 at 3662w5d being seen today for ongoing prenatal care.  She is currently monitored for the following issues for this low-risk pregnancy and has Supervision of normal pregnancy; Rh negative state in antepartum period; Tachycardia; Palpitations; and Dizzy spells on her problem list.  Patient reports pt reports frew episodes of 'passing out'..  Contractions: Irritability. Vag. Bleeding: None.  Movement: Present. Denies leaking of fluid.   The following portions of the patient's history were reviewed and updated as appropriate: allergies, current medications, past family history, past medical history, past social history, past surgical history and problem list. Problem list updated.  Objective:   Vitals:   05/09/16 1608  BP: 132/85  Pulse: 91  Weight: 232 lb (105.2 kg)    Fetal Status: Fetal Heart Rate (bpm):  147   Movement: Present     General:  Alert, oriented and cooperative. Patient is in no acute distress.  Skin: Skin is warm and dry. No rash noted.   Cardiovascular: Normal heart rate noted  Respiratory: Normal respiratory effort, no problems with respiration noted  Abdomen: Soft, gravid, appropriate for gestational age. Pain/Pressure: Present     Pelvic:  Cervical exam performed        Extremities: Normal range of motion.  Edema: None  Mental Status: Normal mood and affect. Normal behavior. Normal judgment and thought content.   Assessment and Plan:  Pregnancy: G3P2002 at 6962w5d  1. Encounter for supervision of normal first pregnancy in third trimester Pt reports passing out as above. These are usually unwitnessed. She is worried about them and requests an IOL at 39weeks.  I have explained to pt that at present she is not favorable and recommend electrolyte rich fluids like Gatorade and Powerade. Rec NOT driving and having someone with her at home.  These sx are not new for her.  She is s/p a workup pprev  which was neg for cardiac abnormalities.  2. Tachycardia stable  3. Rh negative state in antepartum period S/p Rhogam  4. Palpitations  5. Dizzy spells See above  Term labor symptoms and general obstetric precautions including but not limited to vaginal bleeding, contractions, leaking of fluid and fetal movement were reviewed in detail with the patient. Please refer to After Visit Summary for other counseling recommendations.  Return in about 1 week (around 05/16/2016).  Brenda Rosenthalarolyn Harraway-Smith, MD

## 2016-05-09 NOTE — Progress Notes (Signed)
Pt states that she is continuing to pass out. Pt. States that she  passed out Friday, Saturday, Sunday and Monday.

## 2016-05-09 NOTE — Patient Instructions (Signed)

## 2016-05-10 ENCOUNTER — Encounter (HOSPITAL_COMMUNITY): Payer: Self-pay | Admitting: *Deleted

## 2016-05-10 ENCOUNTER — Inpatient Hospital Stay (HOSPITAL_COMMUNITY)
Admission: AD | Admit: 2016-05-10 | Discharge: 2016-05-10 | Disposition: A | Discharge status: Home / Self Care | Payer: Medicaid Other | Source: Ambulatory Visit | Attending: Family Medicine | Admitting: Family Medicine

## 2016-05-10 DIAGNOSIS — R42 Dizziness and giddiness: Secondary | ICD-10-CM

## 2016-05-10 DIAGNOSIS — R000 Tachycardia, unspecified: Secondary | ICD-10-CM

## 2016-05-10 DIAGNOSIS — N898 Other specified noninflammatory disorders of vagina: Secondary | ICD-10-CM | POA: Diagnosis not present

## 2016-05-10 DIAGNOSIS — Z3A37 37 weeks gestation of pregnancy: Secondary | ICD-10-CM | POA: Insufficient documentation

## 2016-05-10 DIAGNOSIS — O26893 Other specified pregnancy related conditions, third trimester: Secondary | ICD-10-CM

## 2016-05-10 DIAGNOSIS — O26899 Other specified pregnancy related conditions, unspecified trimester: Secondary | ICD-10-CM

## 2016-05-10 DIAGNOSIS — Z6791 Unspecified blood type, Rh negative: Secondary | ICD-10-CM

## 2016-05-10 NOTE — Discharge Instructions (Signed)
Keep your scheduled appt for prenatal care. Call the office or provider on call with further concerns or return to MAU as needed. °

## 2016-05-10 NOTE — MAU Note (Addendum)
States she is not sure if her water broke. States she vomited and felt 3 big gushes that wet her jeans. Since then, she was wearing a pad and noted only some dampness. Has been having more thin D/C lately as well. Also states she "passes out a lot." States that she does lose consciousness or goes "in and out" at times. States MD office told her the baby was on the vena cava. States she did this with her first baby also and has a hx of tachycardia.

## 2016-05-10 NOTE — MAU Provider Note (Signed)
Brenda Young is a 25 year old G3P2002 here for rule out rupture of membranes. She was vomiting this morning due to her low blood pressure and with her vomiting felt three gushes of fluid. Fluid was clear; says she might have peed on herself but wasn't sure because her water hasn't broken in the past and doesn't know what that feels like. She denies bleeding or any other leaking of fluid; she feels positive fetal movements. She felt a few contractions at home but currently feels no pain. She has odorless scant white thin discharge but no itching. She does not have any pain with urination.  History     CSN: [id number]  Arrival date and time: 05/10/16 1505   None     Chief Complaint  Patient presents with  . R/O ROM  . Loss of Consciousness   HPI  OB History    Gravida Para Term Preterm AB Living   3 2 2  0 0 2   SAB TAB Ectopic Multiple Live Births   0 0 0 0 2      Past Medical History:  Diagnosis Date  . Anemia   . Depression   . Normal labor 02/20/2012  . Plantar fasciitis   . Polycystic ovarian disease     Past Surgical History:  Procedure Laterality Date  . WISDOM TOOTH EXTRACTION      Family History  Problem Relation Age of Onset  . Diabetes Brother   . Cancer Maternal Grandfather     prostate  . Stroke Paternal Grandfather   . Heart attack Paternal Grandfather     Social History  Substance Use Topics  . Smoking status: Never Smoker  . Smokeless tobacco: Never Used  . Alcohol use No    Allergies:  Allergies  Allergen Reactions  . Avocado Anaphylaxis  . Citrullus Vulgaris Anaphylaxis    Any kind of melons    Prescriptions Prior to Admission  Medication Sig Dispense Refill Last Dose  . albuterol (PROVENTIL HFA;VENTOLIN HFA) 108 (90 Base) MCG/ACT inhaler Inhale into the lungs.   Not Taking  . albuterol (PROVENTIL) (2.5 MG/3ML) 0.083% nebulizer solution 2.5 mg.   Not Taking  . meclizine (ANTIVERT) 32 MG tablet Take 1 tablet (32 mg total) by mouth 3  (three) times daily as needed. (Patient not taking: Reported on 05/09/2016) 60 tablet 0 Not Taking  . Misc. Devices (BREAST PUMP) MISC 1 Units by Does not apply route as needed. (Patient not taking: Reported on 05/09/2016) 1 each 0 Not Taking  . Polyethylene Glycol 3350 (MIRALAX PO) Take by mouth.   Not Taking  . Prenatal Vit-Fe Fumarate-FA (PRENATAL MULTIVITAMIN) TABS Take 1 tablet by mouth daily. Reported on 11/03/2015   Not Taking    Review of Systems  Constitutional: Negative.   Eyes: Negative.   Respiratory: Negative.   Cardiovascular: Negative.        She has been seen in the clinic for syncopal episodes.   Gastrointestinal: Positive for vomiting.  Genitourinary: Negative.   Musculoskeletal: Negative.   Skin: Negative.   Neurological: Negative.   Endo/Heme/Allergies: Negative.    Physical Exam   Blood pressure 102/85, pulse 105, temperature 98.6 F (37 C), temperature source Oral, resp. rate 18, last menstrual period 08/19/2015, SpO2 98 %, unknown if currently breastfeeding.  Physical Exam  Constitutional: She is oriented to person, place, and time. She appears well-developed.  HENT:  Head: Normocephalic.  Eyes: EOM are normal.  Neck: Normal range of motion.  Respiratory: Effort normal.  GI: Soft. She exhibits no distension and no mass. There is no tenderness. There is no rebound and no guarding.  Genitourinary: Vagina normal.  Neurological: She is alert and oriented to person, place, and time.  Skin: Skin is warm and dry.  Cervix is 1 cm and thick. No CMT. Vagina and labia are normal appearing with no lesions. White thin discharge is present with no odor.   MAU Course  Procedures  MDM NST and ferning test   Assessment and Plan  FHR is a Category 1 with accelerations and moderate variability; occasional contractions present but are not felt by the patient. Ferning test was negative. Patient to be discharged home with labor precautions.    Charlesetta GaribaldiKathryn Lorraine Gianluca Chhim  CNM 05/10/2016, 4:28 PM

## 2016-05-18 ENCOUNTER — Ambulatory Visit (INDEPENDENT_AMBULATORY_CARE_PROVIDER_SITE_OTHER): Payer: Medicaid Other | Admitting: Obstetrics & Gynecology

## 2016-05-18 VITALS — BP 117/80 | HR 95 | Temp 98.1°F | Wt 231.0 lb

## 2016-05-18 DIAGNOSIS — O9989 Other specified diseases and conditions complicating pregnancy, childbirth and the puerperium: Secondary | ICD-10-CM

## 2016-05-18 DIAGNOSIS — R000 Tachycardia, unspecified: Secondary | ICD-10-CM

## 2016-05-18 NOTE — Progress Notes (Signed)
   PRENATAL VISIT NOTE  Subjective:  Brenda Young is a 25 y.o. G3P2002 at 4810w0d being seen today for ongoing prenatal care.  She is currently monitored for the following issues for this high-risk pregnancy and has Supervision of normal pregnancy; Rh negative state in antepartum period; Tachycardia; Palpitations; and Dizzy spells on her problem list.  Patient reports syncopal episodes (not new and negative cardilogy workup).  Contractions: Irritability. Vag. Bleeding: None.  Movement: Present. Denies leaking of fluid.   The following portions of the patient's history were reviewed and updated as appropriate: allergies, current medications, past family history, past medical history, past social history, past surgical history and problem list. Problem list updated.  Objective:   Vitals:   05/18/16 1529  BP: 117/80  Pulse: 95  Temp: 98.1 F (36.7 C)  Weight: 231 lb (104.8 kg)    Fetal Status: Fetal Heart Rate (bpm): 143   Movement: Present     General:  Alert, oriented and cooperative. Patient is in no acute distress.  Skin: Skin is warm and dry. No rash noted.   Cardiovascular: Normal heart rate noted  Respiratory: Normal respiratory effort, no problems with respiration noted  Abdomen: Soft, gravid, appropriate for gestational age. Pain/Pressure: Present     Pelvic:  Cervical exam performed        Extremities: Normal range of motion.  Edema: None  Mental Status: Normal mood and affect. Normal behavior. Normal judgment and thought content.   Assessment and Plan:  Pregnancy: G3P2002 at 7010w0d  1. Tachycardia -Vaso vagal response from baby pressing on vena cava (per cards) -Induce at 40 weeks--pt not driving at this time.    2.  Membranes stripped.   RTC tomorrow for membrane stripping again.  Term labor symptoms and general obstetric precautions including but not limited to vaginal bleeding, contractions, leaking of fluid and fetal movement were reviewed in detail with the  patient. Please refer to After Visit Summary for other counseling recommendations.  Return in 1 day (on 05/19/2016).  Lesly DukesKelly H Taleigha Pinson, MD

## 2016-05-19 ENCOUNTER — Encounter: Payer: Self-pay | Admitting: *Deleted

## 2016-05-19 ENCOUNTER — Encounter (HOSPITAL_COMMUNITY): Payer: Self-pay | Admitting: *Deleted

## 2016-05-19 ENCOUNTER — Telehealth: Payer: Self-pay | Admitting: *Deleted

## 2016-05-19 ENCOUNTER — Telehealth (HOSPITAL_COMMUNITY): Payer: Self-pay | Admitting: *Deleted

## 2016-05-19 NOTE — Telephone Encounter (Signed)
Preadmission screen  

## 2016-05-19 NOTE — Telephone Encounter (Signed)
Pt notified of induction scheduled for 05/25/16 @ 6:30 AM

## 2016-05-21 ENCOUNTER — Inpatient Hospital Stay (HOSPITAL_COMMUNITY): Payer: Medicaid Other | Admitting: Anesthesiology

## 2016-05-21 ENCOUNTER — Inpatient Hospital Stay (HOSPITAL_COMMUNITY)
Admission: AD | Admit: 2016-05-21 | Discharge: 2016-05-23 | DRG: 775 | Disposition: A | Discharge status: Home / Self Care | Payer: Medicaid Other | Source: Ambulatory Visit | Attending: Obstetrics and Gynecology | Admitting: Obstetrics and Gynecology

## 2016-05-21 ENCOUNTER — Encounter (HOSPITAL_COMMUNITY): Payer: Self-pay | Admitting: *Deleted

## 2016-05-21 DIAGNOSIS — Z6791 Unspecified blood type, Rh negative: Secondary | ICD-10-CM

## 2016-05-21 DIAGNOSIS — O26893 Other specified pregnancy related conditions, third trimester: Secondary | ICD-10-CM | POA: Diagnosis present

## 2016-05-21 DIAGNOSIS — R000 Tachycardia, unspecified: Secondary | ICD-10-CM

## 2016-05-21 DIAGNOSIS — Z8249 Family history of ischemic heart disease and other diseases of the circulatory system: Secondary | ICD-10-CM

## 2016-05-21 DIAGNOSIS — Z3A39 39 weeks gestation of pregnancy: Secondary | ICD-10-CM

## 2016-05-21 DIAGNOSIS — Z6837 Body mass index (BMI) 37.0-37.9, adult: Secondary | ICD-10-CM

## 2016-05-21 DIAGNOSIS — E669 Obesity, unspecified: Secondary | ICD-10-CM | POA: Diagnosis present

## 2016-05-21 DIAGNOSIS — Z823 Family history of stroke: Secondary | ICD-10-CM

## 2016-05-21 DIAGNOSIS — O4202 Full-term premature rupture of membranes, onset of labor within 24 hours of rupture: Secondary | ICD-10-CM | POA: Diagnosis present

## 2016-05-21 DIAGNOSIS — O99214 Obesity complicating childbirth: Secondary | ICD-10-CM | POA: Diagnosis present

## 2016-05-21 DIAGNOSIS — D649 Anemia, unspecified: Secondary | ICD-10-CM | POA: Diagnosis present

## 2016-05-21 DIAGNOSIS — R42 Dizziness and giddiness: Secondary | ICD-10-CM

## 2016-05-21 DIAGNOSIS — O9081 Anemia of the puerperium: Secondary | ICD-10-CM | POA: Diagnosis present

## 2016-05-21 DIAGNOSIS — O26899 Other specified pregnancy related conditions, unspecified trimester: Secondary | ICD-10-CM

## 2016-05-21 LAB — TYPE AND SCREEN
ABO/RH(D): O NEG
ANTIBODY SCREEN: NEGATIVE

## 2016-05-21 LAB — CBC
HEMATOCRIT: 28.9 % — AB (ref 36.0–46.0)
HEMOGLOBIN: 9.1 g/dL — AB (ref 12.0–15.0)
MCH: 24.3 pg — ABNORMAL LOW (ref 26.0–34.0)
MCHC: 31.5 g/dL (ref 30.0–36.0)
MCV: 77.3 fL — ABNORMAL LOW (ref 78.0–100.0)
Platelets: 166 10*3/uL (ref 150–400)
RBC: 3.74 MIL/uL — AB (ref 3.87–5.11)
RDW: 17.6 % — ABNORMAL HIGH (ref 11.5–15.5)
WBC: 11.8 10*3/uL — AB (ref 4.0–10.5)

## 2016-05-21 MED ORDER — OXYTOCIN BOLUS FROM INFUSION: 500.0000 mL | Freq: Once | INTRAVENOUS | Status: DC

## 2016-05-21 MED ORDER — ONDANSETRON HCL 4 MG/2ML IJ SOLN: 4.0000 mg | Freq: Four times a day (QID) | INTRAMUSCULAR | Status: DC | PRN

## 2016-05-21 MED ORDER — LACTATED RINGERS IV SOLN
500.0000 mL | Freq: Once | INTRAVENOUS | Status: AC
  Administered 2016-05-21: 500 mL via INTRAVENOUS

## 2016-05-21 MED ORDER — LIDOCAINE HCL (PF) 1 % IJ SOLN
INTRAMUSCULAR | Status: DC | PRN
  Administered 2016-05-21: 5 mL via EPIDURAL
  Administered 2016-05-21: 2 mL via EPIDURAL
  Administered 2016-05-21: 3 mL via EPIDURAL

## 2016-05-21 MED ORDER — LACTATED RINGERS IV SOLN: 500.0000 mL | Freq: Once | INTRAVENOUS | Status: DC

## 2016-05-21 MED ORDER — FLEET ENEMA 7-19 GM/118ML RE ENEM: 1.0000 | ENEMA | RECTAL | Status: DC | PRN

## 2016-05-21 MED ORDER — EPHEDRINE 5 MG/ML INJ
10.0000 mg | INTRAVENOUS | Status: DC | PRN
  Filled 2016-05-21: qty 4

## 2016-05-21 MED ORDER — OXYTOCIN 40 UNITS IN LACTATED RINGERS INFUSION - SIMPLE MED
2.5000 [IU]/h | INTRAVENOUS | Status: DC
  Filled 2016-05-21: qty 1000

## 2016-05-21 MED ORDER — SOD CITRATE-CITRIC ACID 500-334 MG/5ML PO SOLN: 30.0000 mL | ORAL | Status: DC | PRN

## 2016-05-21 MED ORDER — OXYCODONE-ACETAMINOPHEN 5-325 MG PO TABS: 2.0000 | ORAL_TABLET | ORAL | Status: DC | PRN

## 2016-05-21 MED ORDER — LACTATED RINGERS IV SOLN: 500.0000 mL | INTRAVENOUS | Status: DC | PRN

## 2016-05-21 MED ORDER — LIDOCAINE HCL (PF) 1 % IJ SOLN
30.0000 mL | INTRAMUSCULAR | Status: DC | PRN
  Filled 2016-05-21: qty 30

## 2016-05-21 MED ORDER — FENTANYL 2.5 MCG/ML BUPIVACAINE 1/10 % EPIDURAL INFUSION (WH - ANES)
14.0000 mL/h | INTRAMUSCULAR | Status: DC | PRN
  Administered 2016-05-21: 14 mL/h via EPIDURAL
  Filled 2016-05-21: qty 100

## 2016-05-21 MED ORDER — ACETAMINOPHEN 325 MG PO TABS: 650.0000 mg | ORAL_TABLET | ORAL | Status: DC | PRN

## 2016-05-21 MED ORDER — PHENYLEPHRINE 40 MCG/ML (10ML) SYRINGE FOR IV PUSH (FOR BLOOD PRESSURE SUPPORT)
80.0000 ug | PREFILLED_SYRINGE | INTRAVENOUS | Status: DC | PRN
  Administered 2016-05-21: 80 ug via INTRAVENOUS
  Filled 2016-05-21: qty 5

## 2016-05-21 MED ORDER — OXYCODONE-ACETAMINOPHEN 5-325 MG PO TABS: 1.0000 | ORAL_TABLET | ORAL | Status: DC | PRN

## 2016-05-21 MED ORDER — TERBUTALINE SULFATE 1 MG/ML IJ SOLN
0.2500 mg | Freq: Once | INTRAMUSCULAR | Status: DC | PRN
  Filled 2016-05-21: qty 1

## 2016-05-21 MED ORDER — PHENYLEPHRINE 40 MCG/ML (10ML) SYRINGE FOR IV PUSH (FOR BLOOD PRESSURE SUPPORT)
80.0000 ug | PREFILLED_SYRINGE | INTRAVENOUS | Status: DC | PRN
  Filled 2016-05-21 (×2): qty 10
  Filled 2016-05-21: qty 5

## 2016-05-21 MED ORDER — LACTATED RINGERS IV SOLN
INTRAVENOUS | Status: DC
  Administered 2016-05-21: 14:00:00 via INTRAVENOUS

## 2016-05-21 MED ORDER — DIPHENHYDRAMINE HCL 50 MG/ML IJ SOLN: 12.5000 mg | INTRAMUSCULAR | Status: DC | PRN

## 2016-05-21 MED ORDER — OXYTOCIN 40 UNITS IN LACTATED RINGERS INFUSION - SIMPLE MED
1.0000 m[IU]/min | INTRAVENOUS | Status: DC
  Administered 2016-05-21: 2 m[IU]/min via INTRAVENOUS

## 2016-05-21 NOTE — Anesthesia Procedure Notes (Signed)
Epidural Patient location during procedure: OB  Staffing Anesthesiologist: Orilla Templeman EDWARD Performed: anesthesiologist   Preanesthetic Checklist Completed: patient identified, pre-op evaluation, timeout performed, IV checked, risks and benefits discussed and monitors and equipment checked  Epidural Patient position: sitting Prep: DuraPrep Patient monitoring: blood pressure and continuous pulse ox Approach: midline Location: L3-L4 Injection technique: LOR air  Needle:  Needle type: Tuohy  Needle gauge: 17 G Needle length: 9 cm Needle insertion depth: 7 cm Catheter size: 19 Gauge Catheter at skin depth: 12 cm Test dose: negative and Other (1% Lidocaine)  Additional Notes Patient identified.  Risk benefits discussed including failed block, incomplete pain control, headache, nerve damage, paralysis, blood pressure changes, nausea, vomiting, reactions to medication both toxic or allergic, and postpartum back pain.  Patient expressed understanding and wished to proceed.  All questions were answered.  Sterile technique used throughout procedure and epidural site dressed with sterile barrier dressing. No paresthesia or other complications noted. The patient did not experience any signs of intravascular injection such as tinnitus or metallic taste in mouth nor signs of intrathecal spread such as rapid motor block. Please see nursing notes for vital signs. Reason for block:procedure for pain     

## 2016-05-21 NOTE — Anesthesia Preprocedure Evaluation (Signed)
Anesthesia Evaluation  Patient identified by MRN, date of birth, ID band Patient awake    Reviewed: Allergy & Precautions, NPO status , Patient's Chart, lab work & pertinent test results  Airway Mallampati: II  TM Distance: >3 FB Neck ROM: Full    Dental  (+) Teeth Intact, Dental Advisory Given   Pulmonary neg pulmonary ROS,    Pulmonary exam normal breath sounds clear to auscultation       Cardiovascular Exercise Tolerance: Good Normal cardiovascular exam Rhythm:Regular Rate:Normal  Palpitations   Neuro/Psych negative neurological ROS     GI/Hepatic negative GI ROS, Neg liver ROS,   Endo/Other  Obesity   Renal/GU negative Renal ROS     Musculoskeletal negative musculoskeletal ROS (+)   Abdominal   Peds  Hematology  (+) Blood dyscrasia, anemia , Plt 166k   Anesthesia Other Findings Day of surgery medications reviewed with the patient.  Reproductive/Obstetrics (+) Pregnancy                             Anesthesia Physical Anesthesia Plan  ASA: II  Anesthesia Plan: Epidural   Post-op Pain Management:    Induction:   Airway Management Planned:   Additional Equipment:   Intra-op Plan:   Post-operative Plan:   Informed Consent: I have reviewed the patients History and Physical, chart, labs and discussed the procedure including the risks, benefits and alternatives for the proposed anesthesia with the patient or authorized representative who has indicated his/her understanding and acceptance.   Dental advisory given  Plan Discussed with:   Anesthesia Plan Comments: (Patient identified. Risks/Benefits/Options discussed with patient including but not limited to bleeding, infection, nerve damage, paralysis, failed block, incomplete pain control, headache, blood pressure changes, nausea, vomiting, reactions to medication both or allergic, itching and postpartum back pain. Confirmed  with bedside nurse the patient's most recent platelet count. Confirmed with patient that they are not currently taking any anticoagulation, have any bleeding history or any family history of bleeding disorders. Patient expressed understanding and wished to proceed. All questions were answered. )        Anesthesia Quick Evaluation

## 2016-05-21 NOTE — H&P (Signed)
Brenda Young is a 25 y.o. female 273P2002 with IUP at 7112w3d presenting for SROM at 1100 with clear fluid. Pt states she has been having  Cramping but no real contractions, associated with no vaginal bleeding.  Membranes are intact, ruptured, with active fetal movement.   [place] 1[place]  Prenatal History/Complications:  Past Medical History: Past Medical History:  Diagnosis Date  . Anemia   . Depression   . Normal labor 02/20/2012  . Plantar fasciitis   . Polycystic ovarian disease   . Syncope     Past Surgical History: Past Surgical History:  Procedure Laterality Date  . WISDOM TOOTH EXTRACTION      Obstetrical History: OB History    Gravida Para Term Preterm AB Living   3 2 2  0 0 2   SAB TAB Ectopic Multiple Live Births   0 0 0 0 2       Social History: Social History   Social History  . Marital status: Married    Spouse name: N/A  . Number of children: 2  . Years of education: N/A   Occupational History  .      Stay at home mother   Social History Main Topics  . Smoking status: Never Smoker  . Smokeless tobacco: Never Used  . Alcohol use No  . Drug use: No  . Sexual activity: Yes   Other Topics Concern  . None   Social History Narrative  . None    Family History: Family History  Problem Relation Age of Onset  . Cancer Maternal Grandfather     prostate  . Stroke Paternal Grandfather   . Heart attack Paternal Grandfather     Allergies: Allergies  Allergen Reactions  . Avocado Anaphylaxis  . Food Anaphylaxis and Other (See Comments)    Pt states that she is allergic to all melons.      Prescriptions Prior to Admission  Medication Sig Dispense Refill Last Dose  . ferrous sulfate 325 (65 FE) MG tablet Take 325 mg by mouth 2 (two) times daily with a meal.   Past Month at Unknown time  . meclizine (ANTIVERT) 32 MG tablet Take 32 mg by mouth 3 (three) times daily as needed for dizziness.   Past Month at Unknown time   . Prenatal MV-Min-FA-Omega-3 (PRENATAL GUMMIES/DHA & FA) 0.4-32.5 MG CHEW Chew 2 each by mouth daily.   Past Month at Unknown time     Prenatal Transfer Tool  Maternal Diabetes: No Genetic Screening: Normal Maternal Ultrasounds/Referrals: Normal Fetal Ultrasounds or other Referrals:  None Maternal Substance Abuse:  No Significant Maternal Medications:  None Significant Maternal Lab Results: None     Review of Systems   Constitutional: Negative for fever and chills Eyes: Negative for visual disturbances Respiratory: Negative for shortness of breath, dyspnea Cardiovascular: Negative for chest pain or palpitations  Gastrointestinal: Negative for vomiting, diarrhea and constipation.  POSITIVE for abdominal pain (contractions) Genitourinary: Negative for dysuria and urgency Musculoskeletal: Negative for back pain, joint pain, myalgias  Neurological: Negative for dizziness and headaches      Blood pressure 130/61, pulse (!) 122, temperature 98.2 F (36.8 C), temperature source Oral, last menstrual period 08/19/2015, unknown if currently breastfeeding. General appearance: alert, cooperative and no distress Lungs: clear to auscultation bilaterally Heart: regular rate and rhythm Abdomen: soft, non-tender; bowel sounds normal Extremities: Homans sign is negative, no sign of DVT Presentation: cephalic Fetal monitoring  Baseline: 150 bpm Uterine activity  Some irratability  Dilation: 3 Exam by:: Dorisann FramesAmanda Jones RN   Prenatal labs: ABO, Rh: O/NEG/-- (04/19 1421) Antibody: NEG (08/17 1545) Rubella: !Error! RPR: NON REAC (08/17 1545)  HBsAg: NEGATIVE (04/19 1421)  HIV: NONREACTIVE (08/17 1545)  GBS:   neg 1 hr Glucola 132 Genetic screening  normal Anatomy US Normal   No results found for this or any previous visit (from the past 24 hour(s)).  Assessment: Brenda GirtSavannah L Wahler is a 25 y.o. [id number] with an IUP at 2269w3d presenting for SROM. Has had an uneventful pregnancy other  than a cardiology consult for vertigo. Denies feeling dizzy or lightheaded right now. Feels positive fetal movements; denies any bleeding. Admit for expectant management and anticipate NSVD.   Plan: #Labor: expectant management #Pain:  Per request #FWB Cat 1 #ID: GBS: neg #MOF:  Breast maybe bottle #MOC: Nuvaring #Circ: NA   Charlesetta GaribaldiKathryn Lorraine Annina Piotrowski CNM 05/21/2016, 2:19 PM

## 2016-05-21 NOTE — MAU Note (Signed)
Pt presents to MAU with complaints of ROM at 11 this morning. Bloody mucous. Reports baby is active

## 2016-05-21 NOTE — Progress Notes (Signed)
Patient still 4-5 cm; IUPC placed. FHR 130 with accelerations and moderate variability. Continue to augment with pitocin.  Luna KitchensKathryn Kooistra CNM

## 2016-05-21 NOTE — Anesthesia Pain Management Evaluation Note (Signed)
  CRNA Pain Management Visit Note  Patient: Brenda Young, 25 y.o., female  "Hello I am a member of the anesthesia team at [facility]. We have an anesthesia team available at all times to provide care throughout the hospital, including epidural management and anesthesia for C-section. I don't know your plan for the delivery whether it a natural birth, water birth, IV sedation, nitrous supplementation, doula or epidural, but we want to meet your pain goals."   1.Was your pain managed to your expectations on prior hospitalizations?   Yes   2.What is your expectation for pain management during this hospitalization?     Epidural  3.How can we help you reach that goal? epidural  Record the patient's initial score and the patient's pain goal.   Pain: 6  Pain Goal: 6 [facility] wants you to be able to say your pain was always managed very well.  Jameia Makris 05/21/2016

## 2016-05-22 LAB — RPR: RPR Ser Ql: NONREACTIVE

## 2016-05-22 MED ORDER — SENNOSIDES-DOCUSATE SODIUM 8.6-50 MG PO TABS
2.0000 | ORAL_TABLET | ORAL | Status: DC
  Administered 2016-05-22 (×2): 2 via ORAL
  Filled 2016-05-22 (×2): qty 2

## 2016-05-22 MED ORDER — PRENATAL MULTIVITAMIN CH
1.0000 | ORAL_TABLET | Freq: Every day | ORAL | Status: DC
  Administered 2016-05-22 – 2016-05-23 (×2): 1 via ORAL
  Filled 2016-05-22 (×2): qty 1

## 2016-05-22 MED ORDER — ONDANSETRON HCL 4 MG PO TABS: 4.0000 mg | ORAL_TABLET | ORAL | Status: DC | PRN

## 2016-05-22 MED ORDER — ONDANSETRON HCL 4 MG/2ML IJ SOLN: 4.0000 mg | INTRAMUSCULAR | Status: DC | PRN

## 2016-05-22 MED ORDER — DIPHENHYDRAMINE HCL 25 MG PO CAPS: 25.0000 mg | ORAL_CAPSULE | Freq: Four times a day (QID) | ORAL | Status: DC | PRN

## 2016-05-22 MED ORDER — ZOLPIDEM TARTRATE 5 MG PO TABS: 5.0000 mg | ORAL_TABLET | Freq: Every evening | ORAL | Status: DC | PRN

## 2016-05-22 MED ORDER — TETANUS-DIPHTH-ACELL PERTUSSIS 5-2.5-18.5 LF-MCG/0.5 IM SUSP: 0.5000 mL | Freq: Once | INTRAMUSCULAR | Status: DC

## 2016-05-22 MED ORDER — ACETAMINOPHEN 325 MG PO TABS: 650.0000 mg | ORAL_TABLET | ORAL | Status: DC | PRN

## 2016-05-22 MED ORDER — DIBUCAINE 1 % RE OINT: 1.0000 "application " | TOPICAL_OINTMENT | RECTAL | Status: DC | PRN

## 2016-05-22 MED ORDER — IBUPROFEN 600 MG PO TABS
600.0000 mg | ORAL_TABLET | Freq: Four times a day (QID) | ORAL | Status: DC
  Administered 2016-05-22 (×3): 600 mg via ORAL
  Filled 2016-05-22 (×7): qty 1

## 2016-05-22 MED ORDER — COCONUT OIL OIL: 1.0000 "application " | TOPICAL_OIL | Status: DC | PRN

## 2016-05-22 MED ORDER — WITCH HAZEL-GLYCERIN EX PADS: 1.0000 "application " | MEDICATED_PAD | CUTANEOUS | Status: DC | PRN

## 2016-05-22 MED ORDER — BENZOCAINE-MENTHOL 20-0.5 % EX AERO: 1.0000 "application " | INHALATION_SPRAY | CUTANEOUS | Status: DC | PRN

## 2016-05-22 MED ORDER — SIMETHICONE 80 MG PO CHEW: 80.0000 mg | CHEWABLE_TABLET | ORAL | Status: DC | PRN

## 2016-05-22 NOTE — Progress Notes (Signed)
MOB was referred for history of depression/anxiety. * Referral screened out by Clinical Social Worker because none of the following criteria appear to apply: ~ History of anxiety/depression during this pregnancy, or of post-partum depression. ~ Diagnosis of anxiety and/or depression within last 3 years OR * MOB's symptoms currently being treated with medication and/or therapy.  CSW reviewed MOB's chart and there was not indication of MH concerns prenatally.   Please contact the Clinical Social Worker if needs arise, or if MOB requests.  Blaine HamperAngel Boyd-Gilyard, MSW, LCSW Clinical Social Work [phone]

## 2016-05-22 NOTE — Anesthesia Postprocedure Evaluation (Signed)
Anesthesia Post Note  Patient: Brenda Young  Procedure(s) Performed: * No procedures listed *  Patient location during evaluation: Mother Baby Anesthesia Type: Epidural Level of consciousness: awake and alert and oriented Pain management: satisfactory to patient Vital Signs Assessment: post-procedure vital signs reviewed and stable Respiratory status: spontaneous breathing and nonlabored ventilation Cardiovascular status: stable Postop Assessment: no headache, no backache, no signs of nausea or vomiting, adequate PO intake and patient able to bend at knees (patient up walking) Anesthetic complications: no     Last Vitals:  Vitals:   05/22/16 0133 05/22/16 0531  BP: (!) 110/56 107/64  Pulse: 96 98  Resp: 16 18  Temp: 37.2 C 36.8 C    Last Pain:  Vitals:   05/22/16 0531  TempSrc: Oral  PainSc:    Pain Goal:                 Madison HickmanGREGORY,[name]

## 2016-05-22 NOTE — Lactation Note (Signed)
This note was copied from a baby's chart. Lactation Consultation Note Mom attempted to BF her other children for long periods of time, but after a few weeks for her 1st child, baby lost weight and had to supplement, then her colostrum left. Mom didn't develop BM. W/her 2nd child, mom BF 4-6 weeks, baby lost weight had to supplement, d/t colostrum doesn't change over to mature milk. Mom has "V" shaped breast w/short shaft compressible nipple. Mom hand expressed easily colostrum.   Mom had several f/u appts. W/LC and Dr.s and couldn't produce milk.  Mom states she is not stressing over the past, she will do what she can as long as she can to BF baby. Mom shown how to use DEBP & how to disassemble, clean, & reassemble parts. Mom knows to pump q3h for 15-20 min. Noted colostrum right away coming out when started pumping.  Mom encouraged to do skin-to-skin especially while BF. Mom encouraged to feed baby 8-12 times/24 hours and with feeding cues. Referred to Baby and Me Book in Breastfeeding section Pg. 22-23 for position options and Proper latch demonstration. Encouraged mom to use support and props for comfort.  [facility]/LC brochure given w/resources, support groups and LC services. Patient Name: Brenda Thresa RossSavannah Lindvall WUJWJ'XToday's Date: 05/22/2016 Reason for consult: Initial assessment   Maternal Data Has patient been taught Hand Expression?: Yes Does the patient have breastfeeding experience prior to this delivery?: No  Feeding Feeding Type: Breast Fed Length of feed: 4 min  LATCH Score/Interventions Latch: Too sleepy or reluctant, no latch achieved, no sucking elicited. Intervention(s): Skin to skin  Audible Swallowing: None Intervention(s): Skin to skin;Hand expression  Type of Nipple: Everted at rest and after stimulation  Comfort (Breast/Nipple): Soft / non-tender     Hold (Positioning): Assistance needed to correctly position infant at breast and maintain latch. Intervention(s):  Breastfeeding basics reviewed;Support Pillows;Position options;Skin to skin  LATCH Score: 5  Lactation Tools Discussed/Used Tools: Pump Breast pump type: Double-Electric Breast Pump Pump Review: Setup, frequency, and cleaning;Milk Storage Initiated by:: Peri JeffersonL. Donny Heffern RN IBCLC Date initiated:: 05/22/16   Consult Status Consult Status: Follow-up Date: 05/23/16 Follow-up type: In-patient    Charyl DancerCARVER, Larra Crunkleton G 05/22/2016, 6:26 AM

## 2016-05-22 NOTE — Progress Notes (Signed)
POSTPARTUM PROGRESS NOTE  Post Partum Day 1 Subjective:  Brenda Young is a 25 y.o. [id number] 6169w4d s/p SVD.  No acute events overnight.  Pt denies problems with ambulating, voiding or po intake.  She denies nausea or vomiting.  Pain is well controlled.  She has had flatus. She has not had bowel movement.  Lochia Minimal. Patient is doing very well, she denies any current pain and is able to eat without difficulty.    Objective: Blood pressure 107/64, pulse 98, temperature 98.2 F (36.8 C), temperature source Oral, resp. rate 18, height 5\' 6"  (1.676 m), weight 104.8 kg (231 lb), last menstrual period 08/19/2015, SpO2 99 %, unknown if currently breastfeeding.  Physical Exam:  General: alert, cooperative and no distress Lochia:normal flow Chest: no respiratory distress Heart:regular rate, distal pulses intact Abdomen: soft, nontender,  Uterine Fundus: firm, appropriately tender DVT Evaluation: No calf swelling or tenderness Extremities: No edema   Recent Labs  05/21/16 1559  HGB 9.1*  HCT 28.9*    Assessment/Plan:  ASSESSMENT: Brenda Young is a 25 y.o. [id number] 6569w4d s/p SVD.  Patient is doing very well overall.    - Continue with breast and bottle feeds - Anticipate D/C tomorrow     LOS: 1 day   Jeannine Kittenathaniel Koutlas MS3 05/22/2016, 7:19 AM    Midwife attestation Post Partum Day 1 I have seen and examined this patient and agree with above documentation in the student's note.   Brenda Young is a 25 y.o. [id number] s/p SVD.  Pt denies problems with ambulating, voiding or po intake. Pain is well controlled. Method of Feeding: breast  PE:  BP (!) 109/59 (BP Location: Left Arm)   Pulse 82   Temp 98 F (36.7 C) (Oral)   Resp 18   Ht 5\' 6"  (1.676 m)   Wt 104.8 kg (231 lb)   LMP 08/19/2015 (Exact Date)   SpO2 99%   BMI 37.28 kg/m  Gen: well appearing Heart: reg rate Lungs: normal WOB Fundus firm Ext: soft, no pain, no edema  Plan for discharge:  tomorrow  Donette LarryMelanie Pilar Corrales, CNM 10:20 AM

## 2016-05-22 NOTE — Care Management Note (Signed)
0150: Verified Blood Bank Band number with current Type & Screen with patient blood band.  No discrepancy found.

## 2016-05-23 MED ORDER — IBUPROFEN 600 MG PO TABS: 600.0000 mg | ORAL_TABLET | Freq: Four times a day (QID) | ORAL | 0 refills | Status: AC

## 2016-05-23 MED ORDER — NORETHINDRONE 0.35 MG PO TABS: 1.0000 | ORAL_TABLET | Freq: Every day | ORAL | 12 refills | Status: DC

## 2016-05-23 NOTE — Lactation Note (Signed)
This note was copied from a baby's chart. Lactation Consultation Note  Patient Name: Girl Thresa RossSavannah Thorner ZOXWR'UToday's Date: 05/23/2016 Reason for consult: Follow-up assessment  Baby is 6439 hours old And at this consult mom latched the baby 5 mins with depth and multiple swallows.  After baby released mom planned to either relatch the other breast or supplement  To increase the wets and stools. Per mom has pumped x2 in the last 24 hours with 6 ml  Per mom has hx of PCOS and with her other 2 babies her milk never came in milk never came  In. With her 2nd baby she was able to pump off 1 whole bottle of colostrum , but her milk never came  In. She ended up supplementing with formula.  LC discussed option - place baby STS and offer both breast , supplement after wards 25 -30 ml , and post pump both breast  For 10 - 15 mins. Or breast feed 1st breast 20 mins , supplement 25 - 20 ml , and post pump other breast for 15 -20 mins.  LC stressed the importance of STS feedings , giving baby practice at the breast , supplement due to her hx of PCOs until the  Milk comes in and follow up with weight checks.  Per mom only has a hand pump at home which she prefers over a DEBP. LC mentioned to mom if she ends up staying another night to add consistent post pumping if she desires.  Through the long conversation , [facility].  Mother informed of post-discharge support and given phone number to the lactation department, including services for phone  call assistance; out-patient appointments; and breastfeeding support group. List of other breastfeeding resources in the community  given in the handout. Encouraged mother to call for problems or concerns related to breastfeeding.   Maternal Data Has patient been taught Hand Expression?: Yes  Feeding Feeding Type: Breast Fed Length of feed: 5 min (depth achieved and multiple swallows noted. baby released 5 )  LATCH  Score/Interventions Latch: Grasps breast easily, tongue down, lips flanged, rhythmical sucking. Intervention(s): Skin to skin;Teach feeding cues;Waking techniques  Audible Swallowing: Spontaneous and intermittent  Type of Nipple: Everted at rest and after stimulation  Comfort (Breast/Nipple): Soft / non-tender     Hold (Positioning): Assistance needed to correctly position infant at breast and maintain latch. Intervention(s): Breastfeeding basics reviewed  LATCH Score: 9  Lactation Tools Discussed/Used Breast pump type: Double-Electric Breast Pump (per mom pumped x2 in the last 24 with 6 ml )   Consult Status Consult Status: Complete Date: 05/23/16    Matilde SprangMargaret Ann Ailis Rigaud 05/23/2016, 1:10 PM

## 2016-05-23 NOTE — Discharge Instructions (Signed)
Vaginal Delivery, Care After °Refer to this sheet in the next few weeks. These discharge instructions provide you with information on caring for yourself after delivery. Your health care provider may also give you specific instructions. Your treatment has been planned according to the most current medical practices available, but problems sometimes occur. Call your health care provider if you have any problems or questions after you go home. °HOME CARE INSTRUCTIONS °· Take over-the-counter or prescription medicines only as directed by your health care provider or pharmacist. °· Do not drink alcohol, especially if you are breastfeeding or taking medicine to relieve pain. °· Do not chew or smoke tobacco. °· Do not use illegal drugs. °· Continue to use good perineal care. Good perineal care includes: °¨ Wiping your perineum from front to back. °¨ Keeping your perineum clean. °· Do not use tampons or douche until your health care provider says it is okay. °· Shower, wash your hair, and take tub baths as directed by your health care provider. °· Wear a well-fitting bra that provides breast support. °· Eat healthy foods. °· Drink enough fluids to keep your urine clear or pale yellow. °· Eat high-fiber foods such as whole grain cereals and breads, brown rice, beans, and fresh fruits and vegetables every day. These foods may help prevent or relieve constipation. °· Follow your health care provider's recommendations regarding resumption of activities such as climbing stairs, driving, lifting, exercising, or traveling. °· Talk to your health care provider about resuming sexual activities. Resumption of sexual activities is dependent upon your risk of infection, your rate of healing, and your comfort and desire to resume sexual activity. °· Try to have someone help you with your household activities and your newborn for at least a few days after you leave the hospital. °· Rest as much as possible. Try to rest or take a nap  when your newborn is sleeping. °· Increase your activities gradually. °· Keep all of your scheduled postpartum appointments. It is very important to keep your scheduled follow-up appointments. At these appointments, your health care provider will be checking to make sure that you are healing physically and emotionally. °SEEK MEDICAL CARE IF:  °· You are passing large clots from your vagina. Save any clots to show your health care provider. °· You have a foul smelling discharge from your vagina. °· You have trouble urinating. °· You are urinating frequently. °· You have pain when you urinate. °· You have a change in your bowel movements. °· You have increasing redness, pain, or swelling near your vaginal incision (episiotomy) or vaginal tear. °· You have pus draining from your episiotomy or vaginal tear. °· Your episiotomy or vaginal tear is separating. °· You have painful, hard, or reddened breasts. °· You have a severe headache. °· You have blurred vision or see spots. °· You feel sad or depressed. °· You have thoughts of hurting yourself or your newborn. °· You have questions about your care, the care of your newborn, or medicines. °· You are dizzy or light-headed. °· You have a rash. °· You have nausea or vomiting. °· You were breastfeeding and have not had a menstrual period within 12 weeks after you stopped breastfeeding. °· You are not breastfeeding and have not had a menstrual period by the 12th week after delivery. °· You have a fever. °SEEK IMMEDIATE MEDICAL CARE IF:  °· You have persistent pain. °· You have chest pain. °· You have shortness of breath. °· You faint. °· You   have leg pain. °· You have stomach pain. °· Your vaginal bleeding saturates two or more sanitary pads in 1 hour. °  °This information is not intended to replace advice given to you by your health care provider. Make sure you discuss any questions you have with your health care provider. °  °Document Released: 06/30/2000 Document Revised:  03/24/2015 Document Reviewed: 02/28/2012 °Elsevier Interactive Patient Education ©2016 [employer] ° °Breastfeeding Challenges and Solutions °Even though breastfeeding is natural, it can be challenging, especially in the first few weeks after childbirth. It is normal for problems to arise when starting to breastfeed your new baby, even if you have breastfed before. This document provides some solutions to the most common breastfeeding challenges.  °CHALLENGES AND SOLUTIONS °Challenge--Cracked or Sore Nipples °Cracked or sore nipples are commonly experienced by breastfeeding mothers. Cracked or sore nipples often are caused by inadequate latching (when your baby's mouth attaches to your breast to breastfeed). Soreness can also happen if your baby is not positioned properly at your breast. Although nipple cracking and soreness are common during the first week after birth, nipple pain is never normal. If you experience nipple cracking or soreness that lasts longer than 1 week or nipple pain, call your health care provider or lactation consultant.  °Solution °Ensure proper latching and positioning of your baby by following the steps below: °· Find a comfortable place to sit or lie down, with your neck and back well supported. °· Place a pillow or rolled up blanket under your baby to bring him or her to the level of your breast (if you are seated). °· Make sure that your baby's abdomen is facing your abdomen. °· Gently massage your breast. With your fingertips, massage from your chest wall toward your nipple in a circular motion. This encourages milk flow. You may need to continue this action during the feeding if your milk flows slowly. °· Support your breast with 4 fingers underneath and your thumb above your nipple. Make sure your fingers are well away from your nipple and your baby's mouth. °· Stroke your baby's lips gently with your finger or nipple. °· When your baby's mouth is open wide enough, quickly bring  your baby to your breast, placing your entire nipple and as much of the colored area around your nipple (areola) as possible into your baby's mouth. °¨ More areola should be visible above your baby's upper lip than below the lower lip. °¨ Your baby's tongue should be between his or her lower gum and your breast. °· Ensure that your baby's mouth is correctly positioned around your nipple (latched). Your baby's lips should create a seal on your breast and be turned out (everted). °· It is common for your baby to suck for about 2-3 minutes in order to start the flow of breast milk. °Signs that your baby has successfully latched on to your nipple include:  °· Quietly tugging or quietly sucking without causing you pain.   °· Swallowing heard between every 3-4 sucks.   °· Muscle movement above and in front of his or her ears with sucking.   °Signs that your baby has not successfully latched on to nipple include:  °· Sucking sounds or smacking sounds from your baby while nursing.   °· Nipple pain.   °Ensure that your breasts stay moisturized and healthy by: °· Avoiding the use of soap on your nipples.   °· Wearing a supportive bra. Avoid wearing underwire-style bras or tight bras.   °· Air drying your nipples for 3-4 minutes   after each feeding.   °· Using only cotton bra pads to absorb breast milk leakage. Leaking of breast milk between feedings is normal.  Be sure to change the pads if they become soaked with milk. °· Using lanolin on your nipples after nursing. Lanolin helps to maintain your skin's normal moisture barrier. If you use pure lanolin you do not need to wash it off before feeding your baby again. Pure lanolin is not toxic to your baby.  You may also hand express a few drops of breast milk and gently massage that milk into your nipples, allowing it to air dry. °Challenge--Breast Engorgement °Breast engorgement is the overfilling of your breasts with breast milk. In the first few weeks after giving birth, you  may experience breast engorgement. Breast engorgement can make your breasts throb and feel hard, tightly stretched, warm, and tender. Engorgement peaks about the fifth day after you give birth. Having breast engorgement does not mean you have to stop breastfeeding your baby. °Solution °· Breastfeed when you feel the need to reduce the fullness of your breasts or when your baby shows signs of hunger. This is called "breastfeeding on demand." °· Newborns (babies younger than 4 weeks) often breastfeed every 1-3 hours during the day. You may need to awaken your baby to feed if he or she is asleep at a feeding time. °· Do not allow your baby to sleep longer than 5 hours during the night without a feeding. °· Pump or hand express breast milk before breastfeeding to soften your breast, areola, and nipple. °· Apply warm, moist heat (in the shower or with warm water-soaked hand towels) just before feeding or pumping, or massage your breast before or during breastfeeding. This increases circulation and helps your milk to flow. °· Completely empty your breasts when breastfeeding or pumping. Afterward, wear a snug bra (nursing or regular) or tank top for 1-2 days to signal your body to slightly decrease milk production. Only wear snug bras or tank tops to treat engorgement. Tight bras typically should be avoided by breastfeeding mothers. Once engorgement is relieved, return to wearing regular, loose-fitting clothes. °· Apply ice packs to your breasts to lessen the pain from engorgement and relieve swelling, unless the ice is uncomfortable for you. °· Do not delay feedings. Try to relax when it is time to feed your baby. This helps to trigger your "let-down reflex," which releases milk from your breast. °· Ensure your baby is latched on to your breast and positioned properly while breastfeeding. °· Allow your baby to remain at your breast as long as he or she is latched on well and actively sucking. Your baby will let you know  when he or she is done breastfeeding by pulling away from your breast or falling asleep. °· Avoid introducing bottles or pacifiers to your baby in the early weeks of breastfeeding. Wait to introduce these things until after resolving any breastfeeding challenges. °· Try to pump your milk on the same schedule as when your baby would breastfeed if you are returning to work or away from home for an extended period. °· Drink plenty of fluids to avoid dehydration, which can eventually put you at greater risk of breast engorgement. °If you follow these suggestions, your engorgement should improve in 24-48 hours. If you are still experiencing difficulty, call your lactation consultant or health care provider.  °Challenge--Plugged Milk Ducts °Plugged milk ducts occur when the duct does not drain milk effectively and becomes swollen. Wearing a tight-fitting nursing bra or having   difficulty with latching may cause plugged milk ducts. Not drinking enough water (8-10 c [1.9-2.4 L] per day) can contribute to plugged milk ducts. Once a duct has become plugged, hard lumps, soreness, and redness may develop in your breast.  °Solution °Do not delay feedings. Feed your baby frequently and try to empty your breasts of milk at each feeding. Try breastfeeding from the affected side first so there is a better chance that the milk will drain completely from that breast. Apply warm, moist towels to your breasts for 5-10 minutes before feeding. Alternatively, a hot shower right before breastfeeding can provide the moist heat that can encourage milk flow. Gentle massage of the sore area before and during a feeding may also help. Avoid wearing tight clothing or bras that put pressure on your breasts. Wear bras that offer good support to your breasts, but avoid underwire bras. If you have a plugged milk duct and develop a fever, you need to see your health care provider.  °Challenge--Mastitis °Mastitis is inflammation of your breast. It  usually is caused by a bacterial infection and can cause flu-like symptoms. You may develop redness in your breast and a fever. Often when mastitis occurs, your breast becomes firm, warm, and very painful. The most common causes of mastitis are poor latching, ineffective sucking from your baby, consistent pressure on your breast (possibly from wearing a tight-fitting bra or shirt that restricts the milk flow), unusual stress or fatigue, or missed feedings.  °Solution °You will be given antibiotic medicine to treat the infection. It is still important to breastfeed frequently to empty your breasts. Continuing to breastfeed while you recover from mastitis will not harm your baby. Make sure your baby is positioned properly during every feeding. Apply moist heat to your breasts for a few minutes before feeding to help the milk flow and to help your breasts empty more easily. °Challenge--Thrush °Thrush is a yeast infection that can form on your nipples, in your breast, or in your baby's mouth. It causes itching, soreness, burning or stabbing pain, and sometimes a rash.  °Solution °You will be given a medicated ointment for your nipples, and your baby will be given a liquid medicine for his or her mouth. It is important that you and your baby are treated at the same time because thrush can be passed between you and your baby. Change disposable nursing pads often. Any bras, towels, or clothing that come in contact with infected areas of your body or your baby's body need to be washed in very hot water every day. Wash your hands and your baby's hands often. All pacifiers, bottle nipples, or toys your baby puts in his or her mouth should be boiled once a day for 20 minutes. After 1 week of treatment, discard pacifiers and bottle nipples and buy new ones. All breast pump parts that touch the milk need to be boiled for 20 minutes every day. °Challenge--Low Milk Supply °You may not be producing enough milk if your baby is not  gaining the proper amount of weight. Breast milk production is based on a supply-and-demand system. Your milk supply depends on how frequently and effectively your baby empties your breast.  °Solution °The more you breastfeed and pump, the more breast milk you will produce. It is important that your baby empties at least one of your breasts at each feeding. If this is not happening, then use a breast pump or hand express any milk that remains. This will help to drain as   much milk as possible at each feeding. It will also signal your body to produce more milk. If your baby is not emptying your breasts, it may be due to latching, sucking, or positioning problems. If low milk supply continues after addressing these issues, contact your health care provider or a lactation specialist as soon as possible. °Challenge--Inverted or Flat Nipples °Some women have nipples that turn inward instead of protruding outward. Other women have nipples that are flat. Inverted or flat nipples can sometimes make it more difficult for your baby to latch onto your breast. °Solution °You may be given a small device that pulls out inverted nipples. This device should be applied right before your baby is brought to your breast. You can also try using a breast pump for a short time before placing the baby at your breast. The pump can pull your nipple outwards to help your infant latch more easily. The baby's sucking motion will help the inverted nipple protrude as well.  °If you have flat nipples, encourage your baby to latch onto your breast and feed frequently in the early days after birth. This will give your baby practice latching on correctly while your breast is still soft. When your milk supply increases, between the second and fifth day after birth and your breasts become full, your baby will have an easier time latching.  °Contact a lactation consultant if you still have concerns. She or he can teach you additional techniques to  address breastfeeding problems related to nipple shape and position.  °FOR MORE INFORMATION °[employer]: www.llli.org °  °This information is not intended to replace advice given to you by your health care provider. Make sure you discuss any questions you have with your health care provider. °  °Document Released: 12/25/2005 Document Revised: 07/24/2014 Document Reviewed: 12/27/2012 °[employer]vier Interactive Patient Education ©2016 [employer] ° °

## 2016-05-23 NOTE — Discharge Summary (Signed)
OB Discharge Summary     Patient Name: Brenda Young DOB: 21-Feb-1991 MRN: [id number]  Date of admission: 05/21/2016 Delivering MD: Donette LarryBHAMBRI, MELANIE   Date of discharge: 05/23/2016  Admitting diagnosis: WATER BROKE Intrauterine pregnancy: 6281w5d     Secondary diagnosis:  Active Problems:   Normal labor  Additional problems: None     Discharge diagnosis: Term Pregnancy Delivered and Anemia                                                                                                Post partum procedures:None (Mom and baby Rh neg-No Rhophylac)  Augmentation: Pitocin  Complications: None  Hospital course:  Onset of Labor With Vaginal Delivery     25 y.o. yo [id number] at 4981w5d was admitted in Latent Labor on 05/21/2016. Patient had an uncomplicated labor course as follows:  Membrane Rupture Time/Date: 11:00 AM ,05/21/2016   Intrapartum Procedures: Episiotomy: None [1]                                         Lacerations:  None [1]  Patient had a delivery of a Viable infant. 05/21/2016  Information for the patient's newborn:  Barbarann Ehlersage, Girl Jeanet [[id number]][[id number]]  Delivery Method: Vaginal, Spontaneous Delivery (Filed from Delivery Summary)    Pateint had an uncomplicated postpartum course.  She is ambulating, tolerating a regular diet, passing flatus, and urinating well. Patient is discharged home in stable condition on 05/23/16.    Physical exam Vitals:   05/22/16 0531 05/22/16 0958 05/22/16 1822 05/23/16 0615  BP: 107/64 118/65 102/69 (!) 109/59  Pulse: 98 98 85 82  Resp: 18 18 18 18   Temp: 98.2 F (36.8 C) 97.8 F (36.6 C) 98.7 F (37.1 C) 98 F (36.7 C)  TempSrc: Oral Oral Oral Oral  SpO2: 99% 99%    Weight:      Height:       General: alert, cooperative and no distress Lochia: appropriate Uterine Fundus: firm Incision: N/A DVT Evaluation: No evidence of DVT seen on physical exam. Labs: Lab Results  Component Value Date   WBC 11.8 (H) 05/21/2016   HGB 9.1 (L)  05/21/2016   HCT 28.9 (L) 05/21/2016   MCV 77.3 (L) 05/21/2016   PLT 166 05/21/2016   CMP Latest Ref Rng & Units 02/04/2012  Glucose 70 - 99 mg/dL 93  BUN 6 - 23 mg/dL 6  Creatinine 1.300.50 - 8.651.10 mg/dL 7.840.53  Sodium 696135 - 295145 mEq/L 135  Potassium 3.5 - 5.1 mEq/L 3.7  Chloride 96 - 112 mEq/L 103  CO2 19 - 32 mEq/L 21  Calcium 8.4 - 10.5 mg/dL 9.0  Total Protein 6.0 - 8.3 g/dL 6.1  Total Bilirubin 0.3 - 1.2 mg/dL 2.8(U0.2(L)  Alkaline Phos 39 - 117 U/L 141(H)  AST 0 - 37 U/L 15  ALT 0 - 35 U/L 11    Discharge instruction: per After Visit Summary and "Baby and Me Booklet".  After visit meds:    Medication List  TAKE these medications   ferrous sulfate 325 (65 FE) MG tablet Take 325 mg by mouth 2 (two) times daily with a meal.   ibuprofen 600 MG tablet Commonly known as:  ADVIL,MOTRIN Take 1 tablet (600 mg total) by mouth every 6 (six) hours.   meclizine 32 MG tablet Commonly known as:  ANTIVERT Take 32 mg by mouth 3 (three) times daily as needed for dizziness.   norethindrone 0.35 MG tablet Commonly known as:  MICRONOR,CAMILA,ERRIN Take 1 tablet (0.35 mg total) by mouth daily. Start taking on:  06/25/2016   PRENATAL GUMMIES/DHA & FA 0.4-32.5 MG Chew Chew 2 each by mouth daily.       Diet: routine diet, iron-rich diet  Activity: Advance as tolerated. Pelvic rest for 6 weeks.   Outpatient follow up:6 weeks Follow up Appt:No future appointments. Follow up Visit: Follow-up Information    [facility] in 6 week(s).   Specialty:  Obstetrics and Gynecology Why:  postartum visit or sooner as needed Contact information: [address] [place] [phone]       [facility] Follow up.   Why:  as needed in emergencies Contact information: [address] [id number] [place] [phone]            Postpartum contraception: Progesterone only pills  Newborn Data: Live born female  Birth Weight: 8 lb 5.7 oz (3790 g) APGAR: 8, 9  Baby Feeding: Bottle and Breast Disposition:home with mother   05/23/2016 Dorathy KinsmanVirginia Maleia Weems, CNM

## 2016-05-25 ENCOUNTER — Inpatient Hospital Stay (HOSPITAL_COMMUNITY): Admission: RE | Admit: 2016-05-25 | Payer: Medicaid Other | Source: Ambulatory Visit

## 2016-07-13 ENCOUNTER — Telehealth: Payer: Self-pay | Admitting: *Deleted

## 2016-07-13 DIAGNOSIS — Z30015 Encounter for initial prescription of vaginal ring hormonal contraceptive: Secondary | ICD-10-CM

## 2016-07-13 MED ORDER — ETONOGESTREL-ETHINYL ESTRADIOL 0.12-0.015 MG/24HR VA RING: VAGINAL_RING | VAGINAL | 12 refills | Status: AC

## 2016-07-13 NOTE — Telephone Encounter (Signed)
Pt called requesting to be put on Nuvaring as she is no longer breast feeding and wants to stop the Micronor.

## 2016-09-07 ENCOUNTER — Encounter (HOSPITAL_COMMUNITY): Payer: Self-pay

## 2017-02-04 IMAGING — US US MFM FETAL NUCHAL TRANSLUCENCY
1 series · 15 of 28 positions shown · non-contrast
Comparison: none

[Series 1: us mfm fetal nuchal translucency · 15 of 39 slices shown]
[im 1/39]
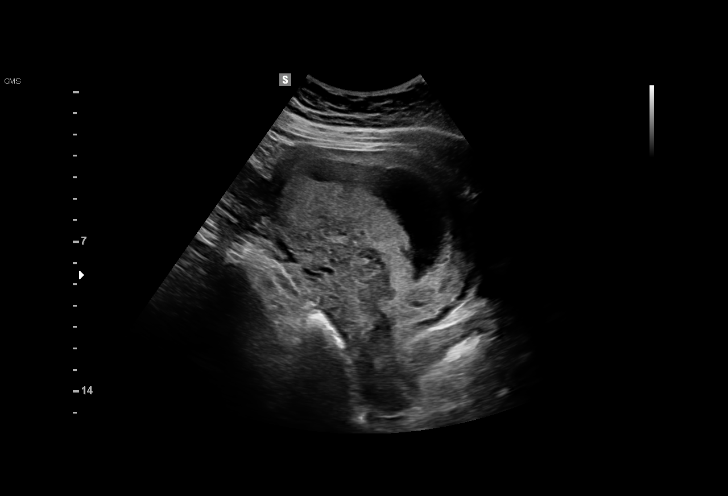
[im 3/39]
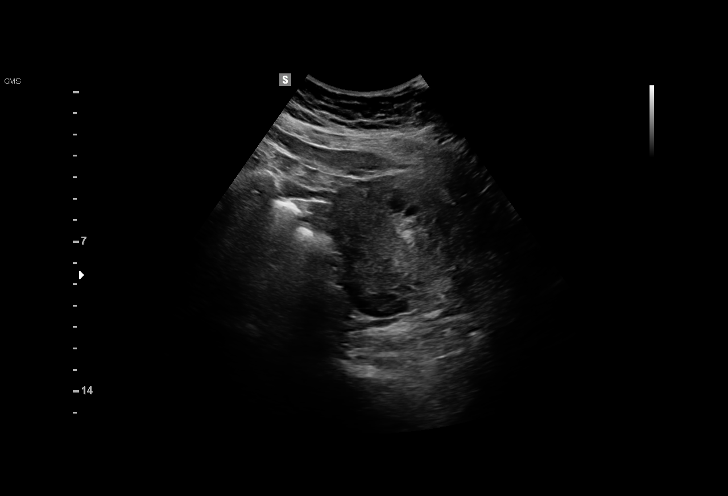
[im 6/39]
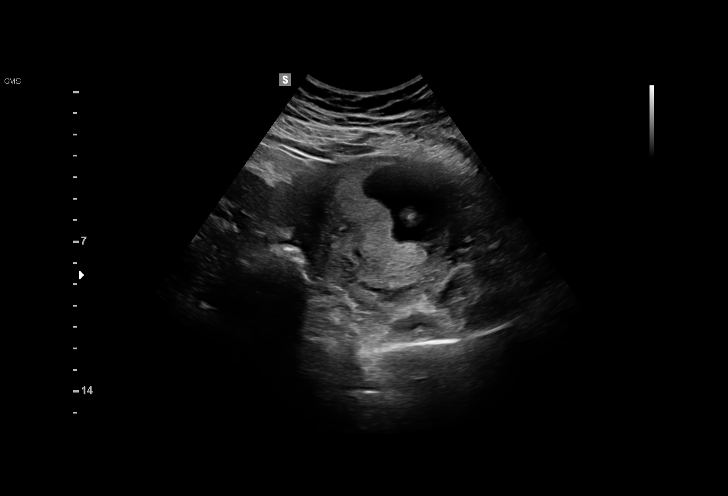
[im 9/39]
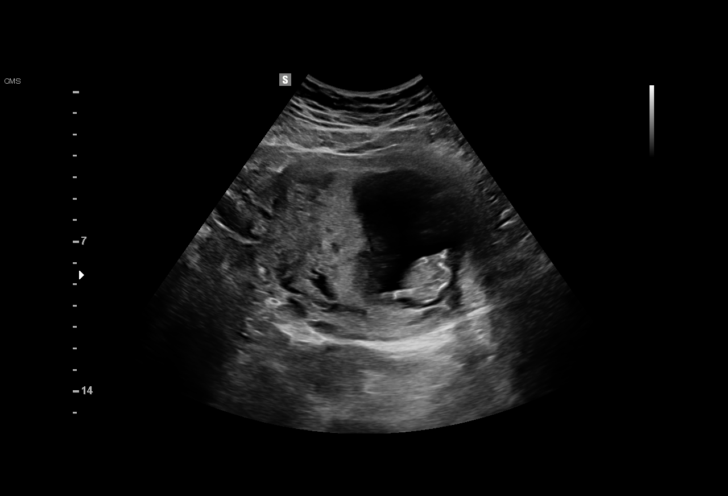
[im 12/39]
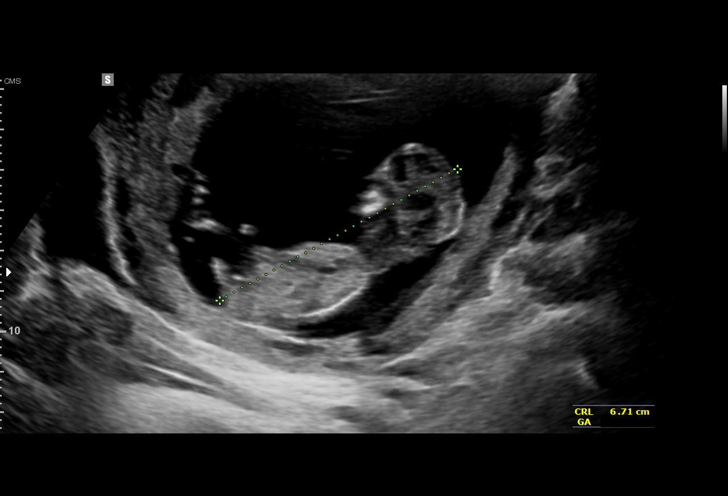
[im 15/39]
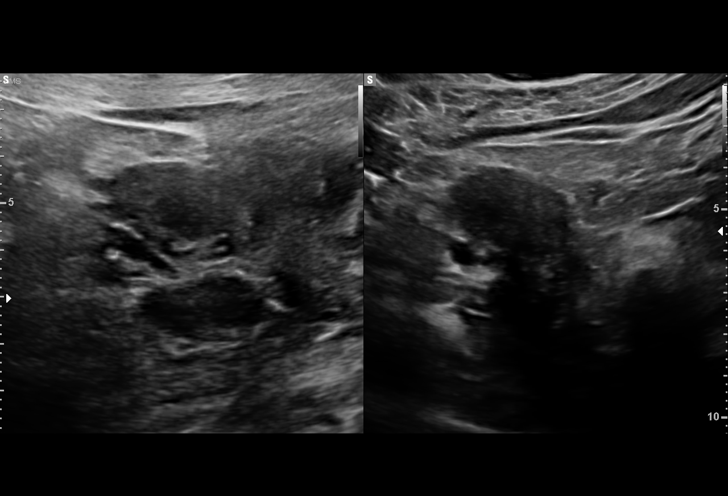
[im 17/39]
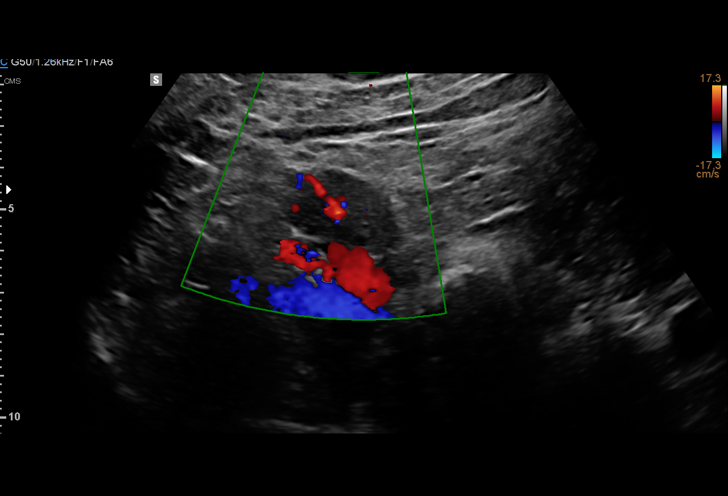
[im 20/39]
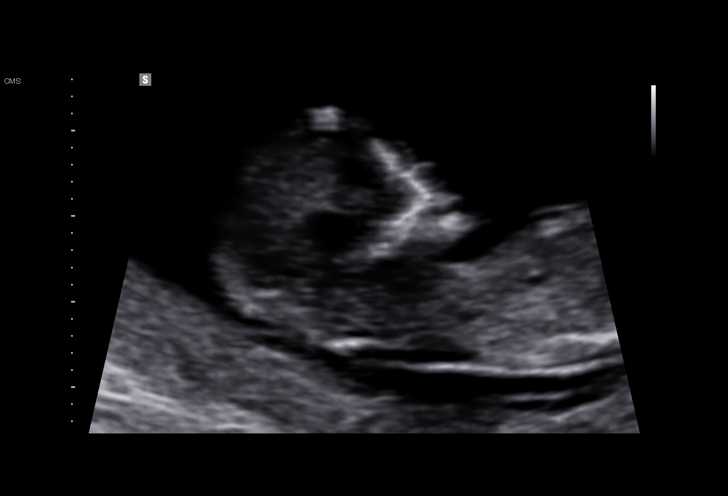
[im 22/39]
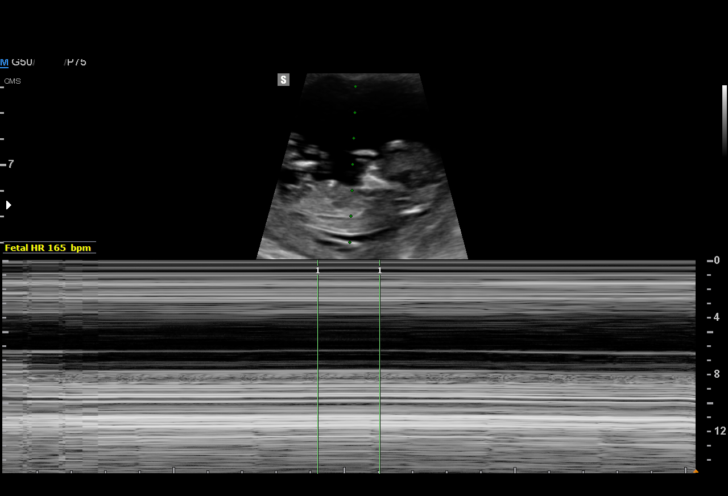
[im 24/39]
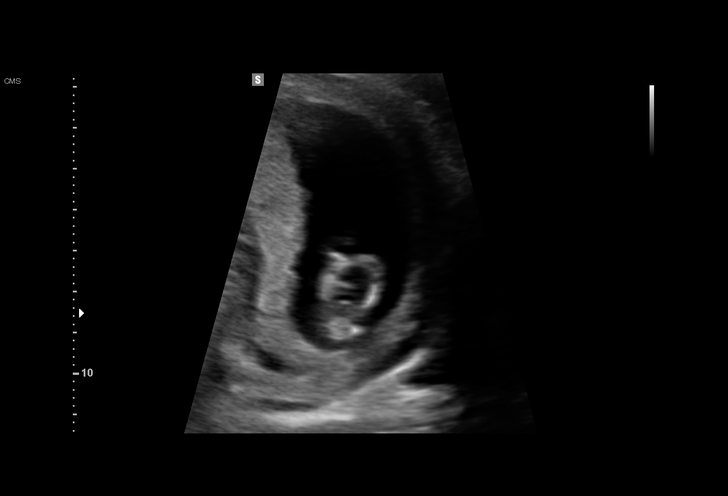
[im 27/39]
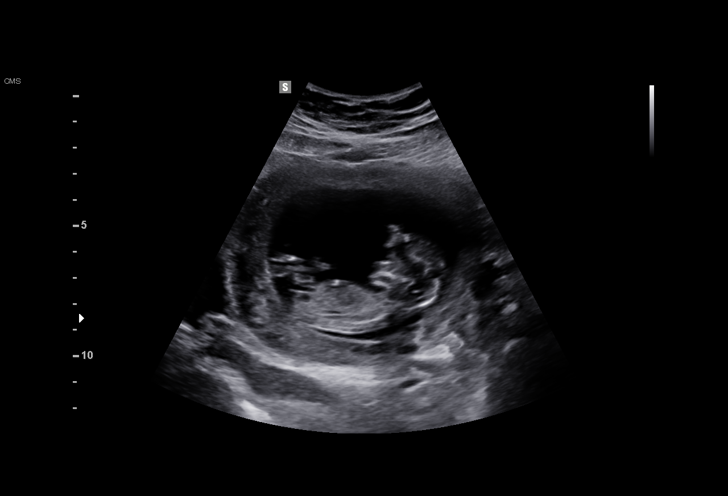
[im 30/39]
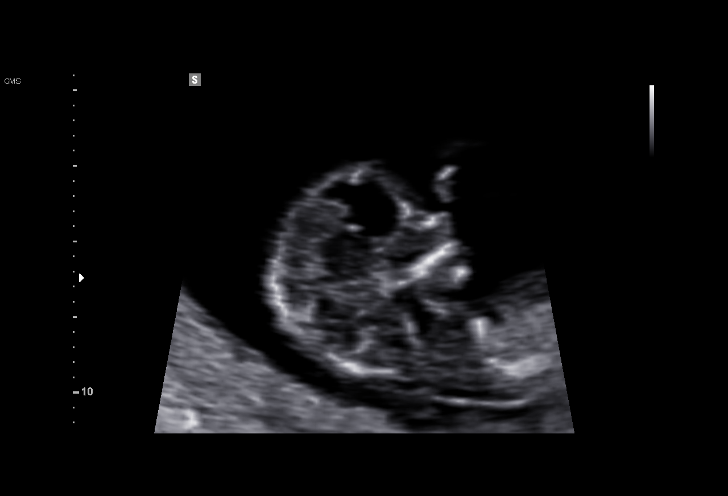
[im 33/39]
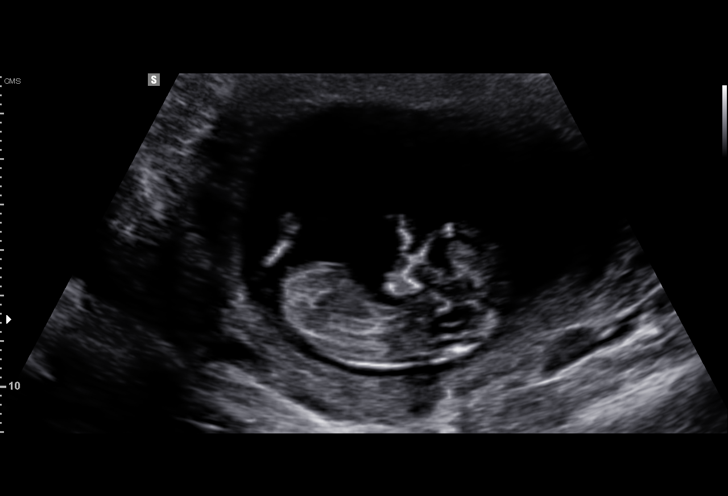
[im 36/39]
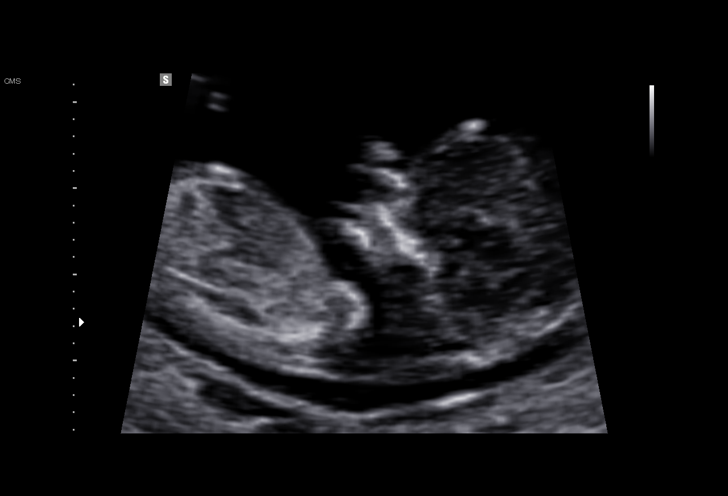
[im 39/39]
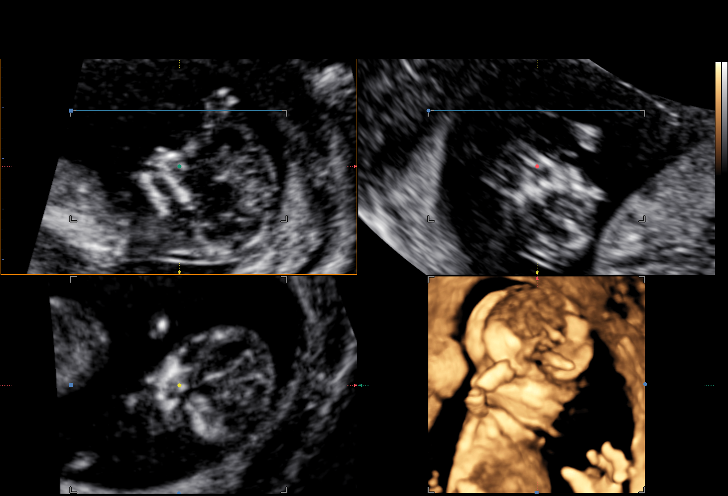

[15 of 28 positions shown; findings below may reference images not displayed]

OB/Gyn Clinic
[REDACTED]-
Faculty Physician

TRANSLUCENCY

1  TEQUILA AZNAR            [id number]       [phone]     [id number]
Indications

12 weeks gestation of pregnancy
First trimester aneuploidy screen (NT)         Z36
OB History

Gravidity:    3         Term:   2        Prem:   0        SAB:   0
TOP:          0       Ectopic:  0        Living: 2
Fetal Evaluation

Num Of Fetuses:     1
Preg. Location:     Intrauterine
Gest. Sac:          Intrauterine
Fetal Pole:         Visualized
Fetal Heart         165
Rate(bpm):
Cardiac Activity:   Observed
Biometry

CRL:      70.1  mm     G. Age:  13w 0d                  EDD:   05/24/16
Gestational Age

LMP:           12w 6d       Date:   08/19/15                 EDD:   05/25/16
Best:          12w 6d    Det. By:   LMP  (08/19/15)          EDD:   05/25/16
1st Trimester Genetic Sonogram Screening

CRL:            70.1  mm    G. Age:   13w 0d                 EDD:   05/24/16
Nasal Bone:                 Present
Cervix Uterus Adnexa

Cervix
Closed.

Uterus
No abnormality visualized.

Left Ovary
Not visualized.

Right Ovary
Within normal limits.

Cul De Sac:   No free fluid seen.

Adnexa:       No abnormality visualized.
Comments

The patient presented for nuchal translucency screen as part
of a first trimester screen.  Blood drawn for serum analytes
today.
Impression

Single living intrauterine pregnancy at 12 weeks 6 days.
CRL consistent with EDC based on LMP.
Pregnancy should be dated by LMP.
No gross fetal anomalies identified.
Recommendations

Recommend follow-up ultrasound examination in 5-6 weeks
for detailed fetal anatomic survey.
